# Patient Record
Sex: Female | Born: 1935 | Race: White | Hispanic: No | Marital: Married | Smoking: Former smoker
Health system: Southern US, Community
[De-identification: ages and names within clinical notes are randomized; demographics above are authoritative.]

## PROBLEM LIST (undated history)

## (undated) DIAGNOSIS — G4700 Insomnia, unspecified: Secondary | ICD-10-CM

## (undated) DIAGNOSIS — I493 Ventricular premature depolarization: Secondary | ICD-10-CM

## (undated) DIAGNOSIS — H269 Unspecified cataract: Secondary | ICD-10-CM

## (undated) DIAGNOSIS — J45909 Unspecified asthma, uncomplicated: Secondary | ICD-10-CM

## (undated) DIAGNOSIS — E039 Hypothyroidism, unspecified: Secondary | ICD-10-CM

## (undated) DIAGNOSIS — Z9889 Other specified postprocedural states: Secondary | ICD-10-CM

## (undated) DIAGNOSIS — C569 Malignant neoplasm of unspecified ovary: Secondary | ICD-10-CM

## (undated) HISTORY — PX: THYROID SURGERY: SHX805

## (undated) HISTORY — PX: EYE SURGERY: SHX253

## (undated) HISTORY — PX: HERNIA REPAIR: SHX51

## (undated) HISTORY — PX: TOTAL VAGINAL HYSTERECTOMY: SHX2548

## (undated) HISTORY — DX: Other specified postprocedural states: Z98.890

## (undated) HISTORY — DX: Ventricular premature depolarization: I49.3

## (undated) HISTORY — PX: BREAST SURGERY: SHX581

## (undated) HISTORY — PX: TOE SURGERY: SHX1073

## (undated) HISTORY — DX: Insomnia, unspecified: G47.00

## (undated) HISTORY — DX: Hypothyroidism, unspecified: E03.9

## (undated) HISTORY — DX: Malignant neoplasm of unspecified ovary: C56.9

## (undated) HISTORY — DX: Unspecified cataract: H26.9

## (undated) HISTORY — DX: Unspecified asthma, uncomplicated: J45.909

---

## 1997-03-16 DIAGNOSIS — C569 Malignant neoplasm of unspecified ovary: Secondary | ICD-10-CM

## 1997-03-16 HISTORY — DX: Malignant neoplasm of unspecified ovary: C56.9

## 2000-08-06 ENCOUNTER — Other Ambulatory Visit: Admission: RE | Admit: 2000-08-06 | Discharge: 2000-08-06 | Payer: Self-pay | Admitting: Urology

## 2000-09-09 ENCOUNTER — Inpatient Hospital Stay (HOSPITAL_COMMUNITY): Admission: RE | Admit: 2000-09-09 | Discharge: 2000-09-10 | Payer: Self-pay | Admitting: General Surgery

## 2000-09-17 ENCOUNTER — Encounter: Payer: Self-pay | Admitting: Emergency Medicine

## 2000-09-17 ENCOUNTER — Emergency Department (HOSPITAL_COMMUNITY): Admission: EM | Admit: 2000-09-17 | Discharge: 2000-09-17 | Payer: Self-pay | Admitting: Emergency Medicine

## 2001-05-19 ENCOUNTER — Encounter: Payer: Self-pay | Admitting: Urology

## 2001-05-19 ENCOUNTER — Ambulatory Visit (HOSPITAL_COMMUNITY): Admission: RE | Admit: 2001-05-19 | Discharge: 2001-05-19 | Payer: Self-pay | Admitting: Urology

## 2001-06-16 ENCOUNTER — Ambulatory Visit (HOSPITAL_COMMUNITY): Admission: RE | Admit: 2001-06-16 | Discharge: 2001-06-16 | Payer: Self-pay | Admitting: General Surgery

## 2001-06-16 ENCOUNTER — Encounter: Payer: Self-pay | Admitting: General Surgery

## 2002-03-16 DIAGNOSIS — Z9889 Other specified postprocedural states: Secondary | ICD-10-CM

## 2002-03-16 HISTORY — DX: Other specified postprocedural states: Z98.890

## 2002-06-20 ENCOUNTER — Encounter: Payer: Self-pay | Admitting: Obstetrics and Gynecology

## 2002-06-20 ENCOUNTER — Ambulatory Visit (HOSPITAL_COMMUNITY): Admission: RE | Admit: 2002-06-20 | Discharge: 2002-06-20 | Payer: Self-pay | Admitting: Obstetrics and Gynecology

## 2002-06-27 ENCOUNTER — Ambulatory Visit (HOSPITAL_COMMUNITY): Admission: RE | Admit: 2002-06-27 | Discharge: 2002-06-27 | Payer: Self-pay | Admitting: Family Medicine

## 2002-06-27 ENCOUNTER — Encounter: Payer: Self-pay | Admitting: Family Medicine

## 2003-06-26 ENCOUNTER — Ambulatory Visit (HOSPITAL_COMMUNITY): Admission: RE | Admit: 2003-06-26 | Discharge: 2003-06-26 | Payer: Self-pay | Admitting: Obstetrics and Gynecology

## 2003-12-17 ENCOUNTER — Ambulatory Visit (HOSPITAL_COMMUNITY): Admission: RE | Admit: 2003-12-17 | Discharge: 2003-12-17 | Payer: Self-pay | Admitting: Podiatry

## 2004-03-26 ENCOUNTER — Ambulatory Visit (HOSPITAL_COMMUNITY): Admission: RE | Admit: 2004-03-26 | Discharge: 2004-03-27 | Payer: Self-pay | Admitting: Family Medicine

## 2004-03-28 ENCOUNTER — Ambulatory Visit: Payer: Self-pay | Admitting: *Deleted

## 2004-07-09 ENCOUNTER — Ambulatory Visit (HOSPITAL_COMMUNITY): Admission: RE | Admit: 2004-07-09 | Discharge: 2004-07-09 | Payer: Self-pay | Admitting: Obstetrics and Gynecology

## 2005-07-15 ENCOUNTER — Ambulatory Visit (HOSPITAL_COMMUNITY): Admission: RE | Admit: 2005-07-15 | Discharge: 2005-07-15 | Payer: Self-pay | Admitting: Obstetrics and Gynecology

## 2006-08-05 ENCOUNTER — Ambulatory Visit (HOSPITAL_COMMUNITY): Admission: RE | Admit: 2006-08-05 | Discharge: 2006-08-05 | Payer: Self-pay | Admitting: Obstetrics and Gynecology

## 2007-01-31 ENCOUNTER — Other Ambulatory Visit: Admission: RE | Admit: 2007-01-31 | Discharge: 2007-01-31 | Payer: Self-pay | Admitting: Obstetrics and Gynecology

## 2007-08-10 ENCOUNTER — Ambulatory Visit (HOSPITAL_COMMUNITY): Admission: RE | Admit: 2007-08-10 | Discharge: 2007-08-10 | Payer: Self-pay | Admitting: Obstetrics and Gynecology

## 2008-02-08 ENCOUNTER — Other Ambulatory Visit: Admission: RE | Admit: 2008-02-08 | Discharge: 2008-02-08 | Payer: Self-pay | Admitting: Obstetrics and Gynecology

## 2008-08-21 ENCOUNTER — Ambulatory Visit (HOSPITAL_COMMUNITY): Admission: RE | Admit: 2008-08-21 | Discharge: 2008-08-21 | Payer: Self-pay | Admitting: Obstetrics and Gynecology

## 2009-07-24 ENCOUNTER — Other Ambulatory Visit: Admission: RE | Admit: 2009-07-24 | Discharge: 2009-07-24 | Payer: Self-pay | Admitting: Obstetrics and Gynecology

## 2009-08-23 ENCOUNTER — Ambulatory Visit: Payer: Self-pay | Admitting: Internal Medicine

## 2009-08-23 DIAGNOSIS — Z860100 Personal history of colon polyps, unspecified: Secondary | ICD-10-CM | POA: Insufficient documentation

## 2009-08-23 DIAGNOSIS — K625 Hemorrhage of anus and rectum: Secondary | ICD-10-CM | POA: Insufficient documentation

## 2009-09-05 ENCOUNTER — Ambulatory Visit (HOSPITAL_COMMUNITY): Admission: RE | Admit: 2009-09-05 | Discharge: 2009-09-05 | Payer: Self-pay | Admitting: Obstetrics and Gynecology

## 2009-09-12 ENCOUNTER — Encounter: Payer: Self-pay | Admitting: Internal Medicine

## 2009-09-18 ENCOUNTER — Ambulatory Visit (HOSPITAL_COMMUNITY): Admission: RE | Admit: 2009-09-18 | Discharge: 2009-09-18 | Payer: Self-pay | Admitting: Obstetrics and Gynecology

## 2010-04-15 NOTE — Assessment & Plan Note (Signed)
Summary: change in bowel habits- due for TCS- cdg   Primary Care Provider:  Simone Curia  Chief Complaint:  change in bowel habits/.  History of Present Illness: 77 year old lady comes to see me for further evaluation of rectal bleeding and a  distant history of colonic polyps. This nice lady tells me that she had a history of colonic polyps removed by Dr. Karilyn Cota many years ago. Last colonoscopy was done by Dr. Viviann Spare [facility] 2004. She had diverticulosis and hemorrhoids. She has a tendency towards chronic constipation, intermittently passing small amount of blood per rectum.  She has a fiber supplement each day at consisting of a fiber cereal and added fiber supplement and takes a little olive  -  bowel movement  once daily. Has not had any melena; no upper GI tract symptoms such as  reflux, nausea, vomiting  or dysphagia. Family history significant for breast cancer but no history of colon polyps or colon cancer. She was told to return 2009 for colonoscopy but did not do so.    Current Medications (verified): 1)  Levothroid 100 Mcg Tabs (Levothyroxine Sodium) .... Take 1 Tablet By Mouth Once A Day 2)  Calcium Citrate Plus D .... Two Tablets Daily 3)  Daily Multi  Tabs (Multiple Vitamins-Minerals) .... Take 1 Tablet By Mouth Once A Day 4)  Aspirin 81 Mg Tbec (Aspirin) .... Take 1 Tablet By Mouth Once A Day 5)  Xalatan 0.005 % Soln (Latanoprost) .... As Directed 6)  Azopt 1 % Susp (Brinzolamide) .... As Directed 7)  Stool Softners .... Take 3 At Bedtime 8)  Fiber Therapy Capsules .... 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  Past Medical History: Hypothyroidsm Ovarian Cancer Surgical Hernia from Hysterectomy Glaucoma in both eyes Cataract in left eye  Past Surgical History: Hysterectomy  Hernia Repair x 2 (hernia from hysterctomy) Thyroid surgery 1966 Left foot (two toes) Right foot (two toes) Left foot  (two toes again) Eye surgery   Family History: Father: Deceased age 81   Lung cancer Mother:Deceased age 80  Emphysema   and heart trouble  Siblings: 4 sisters  One sister had breast cancer and one deceased with  cirrhosis  Social History: Marital Status: Married Children: 2 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 Conjunctiva are pink Abdomen:  flat positive bowel sounds entirely soft and nontender without appreciable mass or organomegaly Rectal:  deferred until time of colonoscopy  Impression & Recommendations: Impression: A 77 year old lady with a distant history of colonic polyps now with intermittent bright red blood per rectum somewhat overdue for followup colonoscopy. Some tendency towards constipation but managed very well with substantial daily fiber supplementation.  Recommendations: Diagnostic colonoscopy in the near future. Risks, benefits, limitations, imponderables and alternatives were reviewed with Ms. Tammy Mosley. Her questions have been answered. She is agreeable. Further recommendations to follow.  Appended Document: change in bowel  habits- due for TCS- cdg

## 2010-04-15 NOTE — Letter (Signed)
Summary: Internal Other  Internal Other   Imported By: Peggyann Shoals 09/12/2009 13:38:44  _____________________________________________________________________  External Attachment:    Type:   Image     Comment:   External Document

## 2010-04-15 NOTE — Letter (Signed)
Summary: TCS ORDER  TCS ORDER   Imported By: Ave Filter 08/23/2009 10:54:21  _____________________________________________________________________  External Attachment:    Type:   Image     Comment:   External Document

## 2010-06-19 ENCOUNTER — Encounter: Payer: Self-pay | Admitting: Gastroenterology

## 2010-06-19 ENCOUNTER — Ambulatory Visit (INDEPENDENT_AMBULATORY_CARE_PROVIDER_SITE_OTHER): Payer: Medicare Other | Admitting: Gastroenterology

## 2010-06-19 DIAGNOSIS — K5900 Constipation, unspecified: Secondary | ICD-10-CM

## 2010-06-19 DIAGNOSIS — K921 Melena: Secondary | ICD-10-CM

## 2010-06-19 DIAGNOSIS — K625 Hemorrhage of anus and rectum: Secondary | ICD-10-CM

## 2010-06-19 MED ORDER — PEG 3350-KCL-NA BICARB-NACL 420 G PO SOLR: ORAL | Status: AC

## 2010-06-19 NOTE — Progress Notes (Signed)
Referring Provider: No ref. provider found Primary Care Physician:  Harlow Asa, MD  Chief Complaint  Patient presents with  . Pre-op Exam    colonoscopy    HPI:   Ms. Tammy Mosley is a pleasant 77 year old Caucasian female who presents today in consult for follow-up colonoscopy. Last seen by [place] in June 2011 secondary to rectal bleeding. Has hx of chronic constipation and hemorrhoids. Notices small to moderate amount of brbpr after being constipated for a few days. Attempts to eat bulky foods, fruits. Generally has a BM every day. Occasionally experiences LLQ mild discomfort where hernia repair was performed with mesh. Denies N/V. No lack of appetite. Had to cancel appt last year for colonoscopy secondary to husband falling and requiring 2 hip surgeries. Has distant hx of colonic polyps.  She is doing quite well and really has no complaints today.  Past Medical History  Diagnosis Date  . Ovarian cancer 1999  . Constipation   . Hypothyroidism   . Glaucoma   . Cataracts, bilateral     Past Surgical History  Procedure Date  . Thyroid surgery   . Total vaginal hysterectomy     due to ovarian cancer  . Hernia repair     X 2  . Breast surgery     calcification removal, L breast  . Toe surgery     toes on both feet straightened  . Eye surgery     Current Outpatient Prescriptions  Medication Sig Dispense Refill  . Bimatoprost (LUMIGAN) 0.01 % SOLN Apply 1 drop to eye daily.        . dorzolamide (TRUSOPT) 2 % ophthalmic solution Place 1 drop into both eyes 2 (two) times daily.        Marland Kitchen levothyroxine (SYNTHROID, LEVOTHROID) 112 MCG tablet Take 112 mcg by mouth daily.          Allergies as of 06/19/2010  . (No Known Allergies)    Family History  Problem Relation Age of Onset  . Colon cancer Neg Hx   . Lung cancer Father 51    deceased  . Emphysema Mother 40    deceased  . Breast cancer Sister     living     History   Social History  . Marital Status: Married   Social  History Main Topics  . Smoking status: Never Smoker   . Smokeless tobacco: Never Used  . Alcohol Use: No  . Drug Use: No   Review of Systems: Gen: Denies fever, chills, anorexia. Denies fatigue, weakness, weight loss.  CV: Denies chest pain, palpitations, syncope, peripheral edema, and claudication. Resp: Denies dyspnea at rest, cough, wheezing, coughing up blood, and pleurisy. GI: See HPI Derm: Denies rash, itching, dry skin Psych: Denies depression, anxiety, memory loss, confusion. No homicidal or suicidal ideation.  Heme: Denies bruising, bleeding, and enlarged lymph nodes.  Physical Exam: BP 160/85  Pulse 77  Temp 98.2 F (36.8 C)  Ht 5\' 7"  (1.702 m)  Wt 171 lb (77.565 kg)  BMI 26.78 kg/m2  SpO2 96% General:   Alert and oriented. No distress noted. Pleasant and cooperative.  Head:  Normocephalic and atraumatic. Eyes:  Conjuctiva clear without scleral icterus. Mouth:  Oral mucosa pink and moist. Good dentition. No lesions. Heart:  S1, S2 present without murmurs, rubs, or gallops. Regular rate and rhythm. Abdomen:  +BS, soft, non-tender and non-distended. No rebound or guarding. No HSM or masses noted. Msk:  Symmetrical without gross deformities. Normal posture. Extremities:  Without edema. Neurologic:  Alert and  oriented x4;  grossly normal neurologically. Skin:  Intact without significant lesions or rashes. Psych:  Alert and cooperative. Normal mood and affect.

## 2010-06-19 NOTE — Patient Instructions (Signed)
We have set you up for a colonoscopy.  Please continue with your high fiber diet We will make further recommendations after the procedure No changes to medication currently.

## 2010-06-22 ENCOUNTER — Encounter: Payer: Self-pay | Admitting: Gastroenterology

## 2010-06-22 DIAGNOSIS — K5900 Constipation, unspecified: Secondary | ICD-10-CM | POA: Insufficient documentation

## 2010-06-22 NOTE — Assessment & Plan Note (Signed)
Likely secondary to known hx of hemorrhoids. Small to mod amount after bouts of constipation. Due for colonoscopy. Will undergo colonoscopy in near future with further recommendations to follow.

## 2010-06-22 NOTE — Assessment & Plan Note (Signed)
77 year old Caucasian female with hx of chronic constipation; managing well on own with high fiber diet, averaging a BM daily. Does note small to moderate amt of brbpr after bouts of constipation. Has hx of hemorrhoids; likely low-volume hematochezia secondary to hemorrhoids. Somewhat overdue for colonoscopy; last performed in 2004 by Dr. Michaelyn Barter.    Continue high fiber diet Proceed with TCS with Dr. Jena Gauss in near future: the risks, benefits, and alternatives have been discussed with the patient in detail. The patient states understanding and desires to proceed.

## 2010-07-01 ENCOUNTER — Encounter: Payer: No Typology Code available for payment source | Admitting: Internal Medicine

## 2010-07-01 ENCOUNTER — Encounter: Payer: Medicare Other | Admitting: Internal Medicine

## 2010-07-01 ENCOUNTER — Ambulatory Visit (HOSPITAL_COMMUNITY)
Admission: RE | Admit: 2010-07-01 | Discharge: 2010-07-01 | Disposition: A | Discharge status: Home / Self Care | Payer: Medicare Other | Source: Ambulatory Visit | Attending: Internal Medicine | Admitting: Internal Medicine

## 2010-07-01 DIAGNOSIS — K921 Melena: Secondary | ICD-10-CM

## 2010-07-01 DIAGNOSIS — K648 Other hemorrhoids: Secondary | ICD-10-CM | POA: Insufficient documentation

## 2010-07-01 DIAGNOSIS — K5730 Diverticulosis of large intestine without perforation or abscess without bleeding: Secondary | ICD-10-CM | POA: Insufficient documentation

## 2010-07-06 NOTE — Op Note (Signed)
  NAME:  Tammy Mosley, Tammy Mosley                  ACCOUNT NO.:  [id number]  MEDICAL RECORD NO.:  [id number]           PATIENT TYPE:  O  LOCATION:  DAYP                          FACILITY:  [facility]  PHYSICIAN:  R. Roetta Sessions, M.D. DATE OF BIRTH:  [birth year]  DATE OF PROCEDURE:  07/01/2010 DATE OF DISCHARGE:                              OPERATIVE REPORT   INDICATIONS FOR PROCEDURE:  A 77 year old lady with chronic constipation now with intermittent hematochezia.  Last colonoscopy was nearly 10 years ago without significant findings reportedly (elsewhere). Colonoscopy is now being done.  Risks, benefits, limitations, alternatives and imponderables have been reviewed, questions answered. Please see the documentation in the medical record.  PROCEDURE NOTE:  O2 saturation, blood pressure, pulse and respirations were monitored throughout the entire procedure.  CONSCIOUS SEDATION: 1. Versed 2 mg IV. 2. Demerol 50 mg IV in divided doses.  INSTRUMENT:  Pentax video chip system.  FINDINGS:  Digital rectal exam revealed no abnormalities.  Endoscopic findings:  Prep was adequate.  Colon:  Colonic mucosa was surveyed from the rectosigmoid junction through the left transverse right colon through the appendiceal orifice, ileocecal valve/cecum.  These structures were well seen and photographed for the record.  From this level, the scope was slowly and cautiously withdrawn.  All previously mentioned mucosal surfaces were again seen.  The patient had scattered left-sided diverticula.  The remainder of the colonic mucosa appeared normal.  Scope was pulled down into the rectum, where a thorough examination of the rectal mucosa including retroflexed view of the anal verge en face view of the anal canal demonstrated minimal friable anal canal hemorrhoids only.  The patient tolerated the procedure well. Cecal withdrawal time 7 minutes.  IMPRESSION: 1. Minimally friable anal canal hemorrhoids, otherwise normal  rectum. 2. Left-sided diverticula.  The remainder of the colonic mucosa     appeared normal.  RECOMMENDATIONS: 1. Hemorrhoid and diverticulosis literature provided to Ms. Garate with a     10-day course of Anusol-HC suppositories one per rectum at bedtime. 2. Daily fiber 1 tablespoon. 3. MiraLax 17 g orally at bedtime nightly any given day with no good     bowel movement. 4. Ms. Treaster is to let me know if this regimen does not facilitate her     bowel regimen.  She is also to let me know if she has any further     rectal bleeding.     Jonathon Bellows, M.D.     RMR/MEDQ  D:  07/01/2010  T:  07/01/2010  Job:  [id number]  cc:   Donna Bernard, M.D. Fax: [phone]  Electronically Signed by Lorrin Goodell M.D. on 07/06/2010 02:08:10 PM

## 2010-08-01 NOTE — Op Note (Signed)
NAME:  Tammy Mosley, Tammy Mosley                  ACCOUNT NO.:  [id number]   MEDICAL RECORD NO.:  [id number]          PATIENT TYPE:  AMB   LOCATION:  DAY                           FACILITY:  [facility]   PHYSICIAN:  Oley Balm. Pricilla Holm, D.P.M.DATE OF BIRTH:  [birth year]   DATE OF PROCEDURE:  12/17/2003  DATE OF DISCHARGE:                                 OPERATIVE REPORT   PREOPERATIVE DIAGNOSIS:  Hammer toe deformity with abductovarus rotation,  third and fourth toes, left foot.   POSTOPERATIVE DIAGNOSIS:  Hammer toe deformity with abductovarus rotation,  third and fourth toes, left foot.   PROCEDURES:  1.  Interphalangeal fusion with arthroplasty, third toe, left foot.  2.  Distal interphalangeal joint arthroplasty, fourth toe, left foot.   SURGEON OF RECORD:  Oley Balm. Pricilla Holm, D.P.M.   ANESTHESIA:  Monitored anesthesia care.   INDICATIONS FOR SURGERY:  A longstanding history of pain and underlapping  third and fourth toes.   The patient brought in the operating room and placed on the operating table  in the supine position.  The patient's left foot and leg were then prepped  and draped in the usual aseptic manner.  Then with an ankle tourniquet in  place and well-padded to prevent contusion, elevated to 250 mmHg,  exsanguination of the left foot.  The following surgical procedures were  then performed under monitored anesthesia care and locally-infiltrated 2%  Xylocaine and 0.05% Marcaine.   ARTHROPLASTY WITH INTERPHALANGEAL FUSION, THIRD TOE, LEFT FOOT:  Attention  directed to the dorsal lateral aspect of the third toe, left foot, where a  teardrop incision was made in a transverse manner.  The intervening flap of  skin was removed.  The incision was made into the interphalangeal joint and  the head of the phalanx freed from its soft tissue attachments dorsally,  medially, laterally, and plantarly.  Utilizing a Zimmer oscillating saw, the  capital fragment of the head of the phalanx was then  resected, the cartilage  from the base denuded, the wound lavaged with a copious amount of sterile  saline, and a pin was retrograded from the distal end of the toe to the  proximal phalanx, stabilizing the toe.  The wound lavaged with a copious  amount of saline.  It as noted that the toe was in a more correct anatomical  as well as functional position.  The wound lavaged with a copious amount of  sterile saline and capsule, subcutaneous tissues __________ sutured with 4-0  Dexon.  The skin was approximated utilizing a horizontal mattress suture of  4-0 Prolene.  Due to the contracture of the flexor tendon, a stab tenotomy  was performed plantarly.  This was accomplished via stab incision and then a  percutaneous release of the tendon.  This was closed with a horizontal  mattress suture of 4-0 Prolene.   DEROTATIONAL ARTHROPLASTY, DISTAL INTERPHALANGEAL JOINT:  Attention was  directed to the distal aspect of the third toe, where a teardrop incision  was made transversely.  This had the effect of abducting the toe.  The  incision was widened  and deepened and the intervening flap of skin was  removed.  The incision was widened and deepened via sharp and blunt  dissection, being sure to identify and retract all vital structures.  A  capsular incision was then made and the phalanx was then revealed and  resected.  The head of the phalanx was then resected utilizing a Zimmer  oscillating saw.  The wound lavaged with a copious amount of sterile saline  and the tendon was reapproximated utilizing one horizontal mattress of 4-0  Dexon.  This had the effect of derotating the distal end of the toe and then  the incision was closed utilizing 4-0 Prolene.  The wound lavaged with a  copious amount of sterile saline before closure.   All surgical sites were then infiltrated with approximately 1 mL each of  dexamethasone phosphate, a mild compressive bandage consisting of Betadine-  soaked Adaptics, 4  x 4's, sterile Kling then applied.  The patient tolerated  the procedure well and left the operating room in apparent good condition  with vital signs stable to the recovery room.      DBT/MEDQ  D:  12/17/2003  T:  12/17/2003  Job:  [id number]

## 2010-08-01 NOTE — H&P (Signed)
NAME:  Tammy Mosley, Tammy Mosley                  ACCOUNT NO.:  [id number]   MEDICAL RECORD NO.:  [id number]          PATIENT TYPE:  AMB   LOCATION:  DAY                           FACILITY:  [facility]   PHYSICIAN:  Oley Balm. Pricilla Holm, D.P.M.DATE OF BIRTH:  Nov 06, 1928   DATE OF ADMISSION:  12/17/2003  DATE OF DISCHARGE:  LH                                HISTORY & PHYSICAL   HISTORY OF PRESENT ILLNESS:  Tammy Mosley is a 77 year old white female who  presented to the office with a chief complaint of painful hammertoe  deformity of the 3rd toe of her left foot.  The patient relates that she had  previous surgery by Dr. Alona Bene of the 2nd toe, but now the 3rd toe has  developed pain.  The patient relates the inability to wear enclosed shoes  for same.   PREVIOUS HOSPITALIZATIONS AND SURGERY:  1.  Previous foot surgery.  2.  Ovarian cancer.  3.  Thyroid biopsy x2.   MEDICATIONS:  Synthroid, calcium, stool softener, over-the-counter sinus  medication, ibuprofen p.r.n.   ALLERGIES:  TAPE.   FAMILY HISTORY:  Family history of arthritis.   SOCIAL HISTORY:  No drinking, no smoking, no transfusion, no hepatitis.   REVIEW OF SYSTEMS:  Review of systems reveals a history of asthma, cancer,  glaucoma, arthritis.  She has an irregular heart beat.   PHYSICAL EXAMINATION:  EXTREMITIES:  Lower extremity exam revealed palpable  pedal pulses, both DP and PT with spontaneous capillary fill time.  NEUROLOGIC:  Exam essentially within normal limits.  MUSCULOSKELETAL:  Exam reveals dorsally contracted 3rd toe of the left foot  and 4th toe of the left foot.   ASSESSMENT AND PLAN:  I have reviewed with her both conservative and  surgical management; the patient requests surgical correction.  I have  reviewed with her interphalangeal fusion with arthroplasty and reviewed with  her the procedures and complications to procedure such as infection, bone  infection, postoperative pain and swelling, etc.  The patient understands  the same and surgery has been scheduled for December 17, 2003.      DBT/MEDQ  D:  12/16/2003  T:  12/17/2003  Job:  [id number]

## 2010-09-29 ENCOUNTER — Other Ambulatory Visit: Payer: Self-pay | Admitting: Obstetrics and Gynecology

## 2010-09-29 DIAGNOSIS — Z139 Encounter for screening, unspecified: Secondary | ICD-10-CM

## 2010-10-02 ENCOUNTER — Ambulatory Visit (HOSPITAL_COMMUNITY)
Admission: RE | Admit: 2010-10-02 | Discharge: 2010-10-02 | Disposition: A | Discharge status: Home / Self Care | Payer: Medicare Other | Source: Ambulatory Visit | Attending: Obstetrics and Gynecology | Admitting: Obstetrics and Gynecology

## 2010-10-02 DIAGNOSIS — Z139 Encounter for screening, unspecified: Secondary | ICD-10-CM

## 2010-10-02 DIAGNOSIS — Z1231 Encounter for screening mammogram for malignant neoplasm of breast: Secondary | ICD-10-CM | POA: Insufficient documentation

## 2011-03-24 DIAGNOSIS — H4010X0 Unspecified open-angle glaucoma, stage unspecified: Secondary | ICD-10-CM | POA: Diagnosis not present

## 2011-03-24 DIAGNOSIS — H2510 Age-related nuclear cataract, unspecified eye: Secondary | ICD-10-CM | POA: Diagnosis not present

## 2011-03-24 DIAGNOSIS — H409 Unspecified glaucoma: Secondary | ICD-10-CM | POA: Diagnosis not present

## 2011-03-24 DIAGNOSIS — Z961 Presence of intraocular lens: Secondary | ICD-10-CM | POA: Diagnosis not present

## 2011-05-20 DIAGNOSIS — H4011X0 Primary open-angle glaucoma, stage unspecified: Secondary | ICD-10-CM | POA: Diagnosis not present

## 2011-05-20 DIAGNOSIS — H409 Unspecified glaucoma: Secondary | ICD-10-CM | POA: Diagnosis not present

## 2011-06-03 DIAGNOSIS — Z83511 Family history of glaucoma: Secondary | ICD-10-CM | POA: Diagnosis not present

## 2011-06-03 DIAGNOSIS — H409 Unspecified glaucoma: Secondary | ICD-10-CM | POA: Diagnosis not present

## 2011-06-03 DIAGNOSIS — H4011X0 Primary open-angle glaucoma, stage unspecified: Secondary | ICD-10-CM | POA: Diagnosis not present

## 2011-07-09 DIAGNOSIS — R5383 Other fatigue: Secondary | ICD-10-CM | POA: Diagnosis not present

## 2011-07-09 DIAGNOSIS — R5381 Other malaise: Secondary | ICD-10-CM | POA: Diagnosis not present

## 2011-07-09 DIAGNOSIS — G4700 Insomnia, unspecified: Secondary | ICD-10-CM | POA: Diagnosis not present

## 2011-07-09 DIAGNOSIS — I959 Hypotension, unspecified: Secondary | ICD-10-CM | POA: Diagnosis not present

## 2011-07-10 DIAGNOSIS — R5381 Other malaise: Secondary | ICD-10-CM | POA: Diagnosis not present

## 2011-07-10 DIAGNOSIS — E039 Hypothyroidism, unspecified: Secondary | ICD-10-CM | POA: Diagnosis not present

## 2011-07-10 DIAGNOSIS — Z79899 Other long term (current) drug therapy: Secondary | ICD-10-CM | POA: Diagnosis not present

## 2011-07-10 DIAGNOSIS — D649 Anemia, unspecified: Secondary | ICD-10-CM | POA: Diagnosis not present

## 2011-07-20 DIAGNOSIS — Z83511 Family history of glaucoma: Secondary | ICD-10-CM | POA: Diagnosis not present

## 2011-07-20 DIAGNOSIS — H409 Unspecified glaucoma: Secondary | ICD-10-CM | POA: Diagnosis not present

## 2011-07-20 DIAGNOSIS — H4011X0 Primary open-angle glaucoma, stage unspecified: Secondary | ICD-10-CM | POA: Diagnosis not present

## 2011-09-15 DIAGNOSIS — H209 Unspecified iridocyclitis: Secondary | ICD-10-CM | POA: Diagnosis not present

## 2011-09-15 DIAGNOSIS — H4011X0 Primary open-angle glaucoma, stage unspecified: Secondary | ICD-10-CM | POA: Diagnosis not present

## 2011-09-15 DIAGNOSIS — H409 Unspecified glaucoma: Secondary | ICD-10-CM | POA: Diagnosis not present

## 2011-10-13 DIAGNOSIS — H4011X0 Primary open-angle glaucoma, stage unspecified: Secondary | ICD-10-CM | POA: Diagnosis not present

## 2011-10-13 DIAGNOSIS — H4010X0 Unspecified open-angle glaucoma, stage unspecified: Secondary | ICD-10-CM | POA: Diagnosis not present

## 2011-10-13 DIAGNOSIS — H409 Unspecified glaucoma: Secondary | ICD-10-CM | POA: Diagnosis not present

## 2011-10-16 ENCOUNTER — Other Ambulatory Visit: Payer: Self-pay | Admitting: Family Medicine

## 2011-10-16 DIAGNOSIS — IMO0001 Reserved for inherently not codable concepts without codable children: Secondary | ICD-10-CM

## 2011-11-12 ENCOUNTER — Ambulatory Visit (HOSPITAL_COMMUNITY): Admission: RE | Admit: 2011-11-12 | Payer: Medicare Other | Source: Ambulatory Visit

## 2011-12-02 DIAGNOSIS — H4011X0 Primary open-angle glaucoma, stage unspecified: Secondary | ICD-10-CM | POA: Diagnosis not present

## 2011-12-02 DIAGNOSIS — H409 Unspecified glaucoma: Secondary | ICD-10-CM | POA: Diagnosis not present

## 2012-01-27 DIAGNOSIS — H409 Unspecified glaucoma: Secondary | ICD-10-CM | POA: Diagnosis not present

## 2012-01-27 DIAGNOSIS — H4010X0 Unspecified open-angle glaucoma, stage unspecified: Secondary | ICD-10-CM | POA: Diagnosis not present

## 2012-03-24 DIAGNOSIS — H409 Unspecified glaucoma: Secondary | ICD-10-CM | POA: Diagnosis not present

## 2012-03-24 DIAGNOSIS — H4011X0 Primary open-angle glaucoma, stage unspecified: Secondary | ICD-10-CM | POA: Diagnosis not present

## 2012-05-25 DIAGNOSIS — H409 Unspecified glaucoma: Secondary | ICD-10-CM | POA: Diagnosis not present

## 2012-05-25 DIAGNOSIS — H4011X0 Primary open-angle glaucoma, stage unspecified: Secondary | ICD-10-CM | POA: Diagnosis not present

## 2012-06-20 ENCOUNTER — Encounter: Payer: Self-pay | Admitting: Family Medicine

## 2012-06-20 ENCOUNTER — Ambulatory Visit (INDEPENDENT_AMBULATORY_CARE_PROVIDER_SITE_OTHER): Payer: Medicare Other | Admitting: Family Medicine

## 2012-06-20 VITALS — BP 130/76 | Temp 98.8°F | Wt 158.0 lb

## 2012-06-20 DIAGNOSIS — E785 Hyperlipidemia, unspecified: Secondary | ICD-10-CM

## 2012-06-20 DIAGNOSIS — E039 Hypothyroidism, unspecified: Secondary | ICD-10-CM | POA: Insufficient documentation

## 2012-06-20 MED ORDER — AMOXICILLIN 500 MG PO TABS: ORAL_TABLET | ORAL | Status: DC

## 2012-06-20 NOTE — Progress Notes (Signed)
  Subjective:    Patient ID: Tammy Mosley, female    DOB: 1928-05-11, 79 y.o.   MRN: [id number]  Cough This is a new problem. The current episode started in the past 7 days. The problem has been gradually worsening. The problem occurs every few minutes. The cough is productive of sputum. Associated symptoms include a rash (few days) and rhinorrhea. Pertinent negatives include no fever.  Rash Associated symptoms include coughing and rhinorrhea. Pertinent negatives include no fever.      Review of Systems  Constitutional: Negative for fever.  HENT: Positive for rhinorrhea.   Respiratory: Positive for cough.   Skin: Positive for rash (few days).       Objective:   Physical Exam   Alert no acute distress. HEENT mild nasal congestion. Right cheek tenderness. Pharynx normal. Lungs clear. Heart regular in rhythm. Very slight patch of eczema and noted right anterior chest. Chest wall borderline tender right upper chest wall anterior    Assessment & Plan:  Impression sinusitis bronchitis. Patient notes also some right-sided chest pain. Sharp in nature. Worse with a cough. Likely chest wall from coughing. Plan symptomatic care discussed. Amoxicillin 500 3 times a day 10 days. Warning signs discussed. WSL

## 2012-06-22 DIAGNOSIS — E039 Hypothyroidism, unspecified: Secondary | ICD-10-CM | POA: Diagnosis not present

## 2012-06-22 DIAGNOSIS — E785 Hyperlipidemia, unspecified: Secondary | ICD-10-CM | POA: Diagnosis not present

## 2012-06-22 LAB — CBC WITH DIFFERENTIAL/PLATELET
Basophils Relative: 0 % (ref 0–1)
HCT: 42.9 % (ref 36.0–46.0)
Hemoglobin: 14.2 g/dL (ref 12.0–15.0)
Lymphocytes Relative: 16 % (ref 12–46)
Lymphs Abs: 1.1 10*3/uL (ref 0.7–4.0)
MCH: 27.7 pg (ref 26.0–34.0)
MCHC: 33.1 g/dL (ref 30.0–36.0)
Neutro Abs: 5.1 10*3/uL (ref 1.7–7.7)
Neutrophils Relative %: 71 % (ref 43–77)
RBC: 5.13 MIL/uL — ABNORMAL HIGH (ref 3.87–5.11)
WBC: 7.1 10*3/uL (ref 4.0–10.5)

## 2012-06-22 LAB — LIPID PANEL
HDL: 49 mg/dL (ref >39)
Total CHOL/HDL Ratio: 5.2 Ratio
Triglycerides: 78 mg/dL (ref <150)

## 2012-07-11 DIAGNOSIS — H409 Unspecified glaucoma: Secondary | ICD-10-CM | POA: Diagnosis not present

## 2012-07-11 DIAGNOSIS — H4011X0 Primary open-angle glaucoma, stage unspecified: Secondary | ICD-10-CM | POA: Diagnosis not present

## 2012-07-11 DIAGNOSIS — H2510 Age-related nuclear cataract, unspecified eye: Secondary | ICD-10-CM | POA: Diagnosis not present

## 2012-07-14 ENCOUNTER — Ambulatory Visit (INDEPENDENT_AMBULATORY_CARE_PROVIDER_SITE_OTHER): Payer: Medicare Other | Admitting: Nurse Practitioner

## 2012-07-14 ENCOUNTER — Encounter: Payer: Self-pay | Admitting: Nurse Practitioner

## 2012-07-14 ENCOUNTER — Ambulatory Visit (HOSPITAL_COMMUNITY)
Admission: RE | Admit: 2012-07-14 | Discharge: 2012-07-14 | Disposition: A | Discharge status: Home / Self Care | Payer: Medicare Other | Source: Ambulatory Visit | Attending: Nurse Practitioner | Admitting: Nurse Practitioner

## 2012-07-14 VITALS — BP 130/70 | Temp 98.1°F | Wt 156.0 lb

## 2012-07-14 DIAGNOSIS — J45901 Unspecified asthma with (acute) exacerbation: Secondary | ICD-10-CM | POA: Diagnosis not present

## 2012-07-14 DIAGNOSIS — R05 Cough: Secondary | ICD-10-CM | POA: Diagnosis not present

## 2012-07-14 DIAGNOSIS — J069 Acute upper respiratory infection, unspecified: Secondary | ICD-10-CM | POA: Diagnosis not present

## 2012-07-14 DIAGNOSIS — R0609 Other forms of dyspnea: Secondary | ICD-10-CM

## 2012-07-14 DIAGNOSIS — R0602 Shortness of breath: Secondary | ICD-10-CM | POA: Insufficient documentation

## 2012-07-14 DIAGNOSIS — J988 Other specified respiratory disorders: Secondary | ICD-10-CM | POA: Diagnosis not present

## 2012-07-14 DIAGNOSIS — R0600 Dyspnea, unspecified: Secondary | ICD-10-CM

## 2012-07-14 DIAGNOSIS — R059 Cough, unspecified: Secondary | ICD-10-CM | POA: Diagnosis not present

## 2012-07-14 DIAGNOSIS — R0989 Other specified symptoms and signs involving the circulatory and respiratory systems: Secondary | ICD-10-CM | POA: Diagnosis not present

## 2012-07-14 MED ORDER — ALBUTEROL SULFATE HFA 108 (90 BASE) MCG/ACT IN AERS: 2.0000 | INHALATION_SPRAY | RESPIRATORY_TRACT | Status: DC | PRN

## 2012-07-14 MED ORDER — ALBUTEROL SULFATE (2.5 MG/3ML) 0.083% IN NEBU
2.5000 mg | INHALATION_SOLUTION | Freq: Once | RESPIRATORY_TRACT | Status: AC
  Administered 2012-07-14: 2.5 mg via RESPIRATORY_TRACT

## 2012-07-14 MED ORDER — AZITHROMYCIN 250 MG PO TABS: ORAL_TABLET | ORAL | Status: DC

## 2012-07-15 DIAGNOSIS — J45901 Unspecified asthma with (acute) exacerbation: Secondary | ICD-10-CM | POA: Insufficient documentation

## 2012-07-15 NOTE — Progress Notes (Signed)
Subjective:  Presents with continued complaints of cough and shortness of breath. Frequent cough. Producing a small amount of white mucus at times. Slight chest tightness with deep breath or cough. No wheezing. Has not used her inhaler. No edema. No orthopnea. No runny nose ear pain or sore throat. No headache. PMH asthma. Last seen on 4/7. Minimal improvement on amoxicillin.  Objective:   BP 130/70  Temp(Src) 98.1 F (36.7 C) (Oral)  Wt 156 lb (70.761 kg)  BMI 24.43 kg/m2  SpO2 98% NAD. Alert, oriented. TMs clear effusion, no erythema. Pharynx mildly erythematous with PND noted. Neck supple with mild soft nontender adenopathy. Lungs initially mildly diminished breath sounds in general. No wheezing. Given albuterol 2.5 mg neb treatment. Airflow much improved with subjective improvement of symptoms. O2 sat 98% on room air. Heart regular rate rhythm.  Assessment:Asthma with acute exacerbation - Plan: albuterol (PROVENTIL) (2.5 MG/3ML) 0.083% nebulizer solution 2.5 mg, DG Chest 2 View  Dyspnea  Plan: Meds ordered this encounter  Medications  . albuterol (PROVENTIL) (2.5 MG/3ML) 0.083% nebulizer solution 2.5 mg    Sig:   . azithromycin (ZITHROMAX Z-PAK) 250 MG tablet    Sig: Take 2 tablets (500 mg) on  Day 1,  followed by 1 tablet (250 mg) once daily on Days 2 through 5.    Dispense:  6 each    Refill:  0    Order Specific Question:  Supervising Provider    Answer:  Merlyn Albert [[id number]]  . albuterol (PROVENTIL HFA;VENTOLIN HFA) 108 (90 BASE) MCG/ACT inhaler    Sig: Inhale 2 puffs into the lungs every 4 (four) hours as needed for wheezing.    Dispense:  1 Inhaler    Refill:  2    Order Specific Question:  Supervising Provider    Answer:  Merlyn Albert [[id number]]   obtain chest x-ray. OTC meds as directed for congestion and cough. Warning signs reviewed. Call back in 4-5 days if no improvement, call or go to ED sooner if worse.

## 2012-07-15 NOTE — Assessment & Plan Note (Signed)
Albuterol inhaler as directed when necessary. Z-Pak as directed.

## 2012-08-02 DIAGNOSIS — H2510 Age-related nuclear cataract, unspecified eye: Secondary | ICD-10-CM | POA: Diagnosis not present

## 2012-08-02 DIAGNOSIS — Z538 Procedure and treatment not carried out for other reasons: Secondary | ICD-10-CM | POA: Diagnosis not present

## 2012-09-01 DIAGNOSIS — H2510 Age-related nuclear cataract, unspecified eye: Secondary | ICD-10-CM | POA: Diagnosis not present

## 2012-09-01 DIAGNOSIS — G4700 Insomnia, unspecified: Secondary | ICD-10-CM | POA: Diagnosis not present

## 2012-09-01 DIAGNOSIS — Z79899 Other long term (current) drug therapy: Secondary | ICD-10-CM | POA: Diagnosis not present

## 2012-09-01 DIAGNOSIS — R42 Dizziness and giddiness: Secondary | ICD-10-CM | POA: Diagnosis not present

## 2012-09-01 DIAGNOSIS — H2589 Other age-related cataract: Secondary | ICD-10-CM | POA: Diagnosis not present

## 2012-09-01 DIAGNOSIS — H4011X0 Primary open-angle glaucoma, stage unspecified: Secondary | ICD-10-CM | POA: Diagnosis not present

## 2012-09-01 DIAGNOSIS — Z9849 Cataract extraction status, unspecified eye: Secondary | ICD-10-CM | POA: Diagnosis not present

## 2012-09-01 DIAGNOSIS — K5900 Constipation, unspecified: Secondary | ICD-10-CM | POA: Diagnosis not present

## 2012-09-01 DIAGNOSIS — Z961 Presence of intraocular lens: Secondary | ICD-10-CM | POA: Diagnosis not present

## 2012-09-01 DIAGNOSIS — H4010X0 Unspecified open-angle glaucoma, stage unspecified: Secondary | ICD-10-CM | POA: Diagnosis not present

## 2012-09-01 DIAGNOSIS — E039 Hypothyroidism, unspecified: Secondary | ICD-10-CM | POA: Diagnosis not present

## 2012-09-01 DIAGNOSIS — H409 Unspecified glaucoma: Secondary | ICD-10-CM | POA: Diagnosis not present

## 2012-09-01 DIAGNOSIS — Z7982 Long term (current) use of aspirin: Secondary | ICD-10-CM | POA: Diagnosis not present

## 2012-09-01 DIAGNOSIS — Z87891 Personal history of nicotine dependence: Secondary | ICD-10-CM | POA: Diagnosis not present

## 2012-09-02 DIAGNOSIS — Z9889 Other specified postprocedural states: Secondary | ICD-10-CM | POA: Diagnosis not present

## 2012-09-02 DIAGNOSIS — Z9849 Cataract extraction status, unspecified eye: Secondary | ICD-10-CM | POA: Diagnosis not present

## 2012-09-27 DIAGNOSIS — Z961 Presence of intraocular lens: Secondary | ICD-10-CM | POA: Diagnosis not present

## 2012-09-27 DIAGNOSIS — H4010X0 Unspecified open-angle glaucoma, stage unspecified: Secondary | ICD-10-CM | POA: Diagnosis not present

## 2012-09-27 DIAGNOSIS — H409 Unspecified glaucoma: Secondary | ICD-10-CM | POA: Diagnosis not present

## 2012-09-27 DIAGNOSIS — H2510 Age-related nuclear cataract, unspecified eye: Secondary | ICD-10-CM | POA: Diagnosis not present

## 2012-11-29 DIAGNOSIS — H409 Unspecified glaucoma: Secondary | ICD-10-CM | POA: Diagnosis not present

## 2012-11-29 DIAGNOSIS — H2510 Age-related nuclear cataract, unspecified eye: Secondary | ICD-10-CM | POA: Diagnosis not present

## 2012-11-29 DIAGNOSIS — H4010X0 Unspecified open-angle glaucoma, stage unspecified: Secondary | ICD-10-CM | POA: Diagnosis not present

## 2012-11-29 DIAGNOSIS — Z961 Presence of intraocular lens: Secondary | ICD-10-CM | POA: Diagnosis not present

## 2012-12-15 ENCOUNTER — Ambulatory Visit: Payer: No Typology Code available for payment source | Admitting: Family Medicine

## 2012-12-19 ENCOUNTER — Encounter: Payer: Self-pay | Admitting: Family Medicine

## 2012-12-19 ENCOUNTER — Ambulatory Visit (INDEPENDENT_AMBULATORY_CARE_PROVIDER_SITE_OTHER): Payer: Medicare Other | Admitting: Family Medicine

## 2012-12-19 ENCOUNTER — Ambulatory Visit (HOSPITAL_COMMUNITY)
Admission: RE | Admit: 2012-12-19 | Discharge: 2012-12-19 | Disposition: A | Discharge status: Home / Self Care | Payer: Medicare Other | Source: Ambulatory Visit | Attending: Family Medicine | Admitting: Family Medicine

## 2012-12-19 VITALS — BP 144/80 | Ht 68.0 in | Wt 160.0 lb

## 2012-12-19 DIAGNOSIS — E782 Mixed hyperlipidemia: Secondary | ICD-10-CM | POA: Diagnosis not present

## 2012-12-19 DIAGNOSIS — E039 Hypothyroidism, unspecified: Secondary | ICD-10-CM

## 2012-12-19 DIAGNOSIS — M25539 Pain in unspecified wrist: Secondary | ICD-10-CM | POA: Insufficient documentation

## 2012-12-19 DIAGNOSIS — S52539A Colles' fracture of unspecified radius, initial encounter for closed fracture: Secondary | ICD-10-CM | POA: Insufficient documentation

## 2012-12-19 DIAGNOSIS — Z23 Encounter for immunization: Secondary | ICD-10-CM | POA: Diagnosis not present

## 2012-12-19 DIAGNOSIS — R413 Other amnesia: Secondary | ICD-10-CM

## 2012-12-19 DIAGNOSIS — M25531 Pain in right wrist: Secondary | ICD-10-CM

## 2012-12-19 DIAGNOSIS — E785 Hyperlipidemia, unspecified: Secondary | ICD-10-CM | POA: Diagnosis not present

## 2012-12-19 DIAGNOSIS — S52599A Other fractures of lower end of unspecified radius, initial encounter for closed fracture: Secondary | ICD-10-CM | POA: Diagnosis not present

## 2012-12-19 DIAGNOSIS — W19XXXA Unspecified fall, initial encounter: Secondary | ICD-10-CM | POA: Insufficient documentation

## 2012-12-19 NOTE — Progress Notes (Signed)
  Subjective:    Patient ID: Tammy Mosley, female    DOB: [birth year], 79 y.o.   MRN: [id number]  HPIFell off porch last week and hurt right arm. Took a tumble off of the porch,fell backwards. Wk ago sat.  Thumb better today, sore last wk.  Leaves light on and doors shut, constantly misplacing stuff, trouble remembering names  Patient appears to be less concerned about her memory difficulties than her husband. He is more concerned. There is a strong family history of dementia. The patient's older sister has dementia.  Husband gives multiple examples of how the patient's short-term memory is not doing nearly as well as before.  Patient claims compliance with her thyroid medication. This spring however her thyroid was not in good control. On further history she admits to not lose remembering when to take the thyroid medication out of the bottle.    Requesting flu vaccine.   Memory loss. MMSE score 27/30.    Review of Systems No chest pain no back pain no abdominal pain no change in bowel habits    Objective:   Physical Exam  Alert pleasant no acute distress oriented x3 lungs clear heart regular in rhythm wrists distinctly tender ankles without edema. Right wrist distinctly tender some swelling. TSH this spring was elevated in the mid 20s.      Assessment & Plan:  Impression acute wrist injury with need for further analysis. #2 hypothyroidism wondered if related to #3. Needs repeat blood work. #3 excess of short-term memory loss. Not enough on the MMSE to warrant a dementia workup at this time. However this may change in the future discussed at great length. Plan appropriate blood work. Right wrist x-ray. Hold off on 2 dementia workup recheck as scheduled. 35-40 minutes spet most in discussion. WSL

## 2012-12-20 ENCOUNTER — Other Ambulatory Visit: Payer: Self-pay | Admitting: *Deleted

## 2012-12-20 DIAGNOSIS — E782 Mixed hyperlipidemia: Secondary | ICD-10-CM | POA: Diagnosis not present

## 2012-12-20 DIAGNOSIS — S62101A Fracture of unspecified carpal bone, right wrist, initial encounter for closed fracture: Secondary | ICD-10-CM

## 2012-12-20 DIAGNOSIS — R413 Other amnesia: Secondary | ICD-10-CM | POA: Insufficient documentation

## 2012-12-20 DIAGNOSIS — E039 Hypothyroidism, unspecified: Secondary | ICD-10-CM | POA: Diagnosis not present

## 2012-12-20 LAB — LIPID PANEL
Cholesterol: 250 mg/dL — ABNORMAL HIGH (ref 0–200)
LDL Cholesterol: 182 mg/dL — ABNORMAL HIGH (ref 0–99)

## 2012-12-21 ENCOUNTER — Encounter: Payer: Self-pay | Admitting: Orthopedic Surgery

## 2012-12-21 ENCOUNTER — Ambulatory Visit (INDEPENDENT_AMBULATORY_CARE_PROVIDER_SITE_OTHER): Payer: Medicare Other | Admitting: Orthopedic Surgery

## 2012-12-21 VITALS — BP 127/65 | Ht 68.0 in | Wt 157.0 lb

## 2012-12-21 DIAGNOSIS — S62109A Fracture of unspecified carpal bone, unspecified wrist, initial encounter for closed fracture: Secondary | ICD-10-CM | POA: Diagnosis not present

## 2012-12-21 DIAGNOSIS — S62101A Fracture of unspecified carpal bone, right wrist, initial encounter for closed fracture: Secondary | ICD-10-CM

## 2012-12-21 NOTE — Patient Instructions (Signed)
Brace x 6 weeks 

## 2012-12-21 NOTE — Progress Notes (Signed)
  Subjective:    Patient ID: Tammy Mosley, female    DOB: [birth year], 79 y.o.   MRN: [id number]  Chief Complaint  Patient presents with  . Wrist Pain    Right wrist fracture d/t injury 12/09/12. Referred by Dr. Lubertha South    Wrist Pain    patient presents after falling off of a porch on September 27. Complains of pain in the wrist which got worse so she went to the emergency room. She now has sharp and dull pain 6/10 comes and goes seems to be worse with use and she does note swelling and bruising   She does note to have an overactive thyroid  No allergies  She had a complete hysterectomy  Should great toe surgery bilaterally  She has a family history of heart disease lung disease cancer and diabetes she is currently retired    Review of Systems Review of systems renal normal findings except for some watering in her left is some constipation unsteady gait and seasonal allergies     Objective:   Physical Exam  Nursing note and vitals reviewed. Constitutional: She is oriented to person, place, and time. She appears well-developed and well-nourished. No distress.  HENT:  Head: Normocephalic.  Eyes: Right eye exhibits no discharge. Left eye exhibits no discharge.  Neck: No tracheal deviation present.  Cardiovascular: Intact distal pulses.   Lymphadenopathy:       Right: No epitrochlear adenopathy present.       Left: No epitrochlear adenopathy present.  Neurological: She is alert and oriented to person, place, and time. She exhibits normal muscle tone.  Skin: Skin is warm and dry. No rash noted. She is not diaphoretic. No erythema.  Psychiatric: She has a normal mood and affect. Her behavior is normal. Judgment and thought content normal.   right wrist no deformity tenderness over the distal radius Range of motion remains normal but painful including fingers and wrist joint elbow joint nontender shoulder nontender Muscle tone is normal without atrophy or tremor Grip  strength is normal  The x-ray showed an intra-articular nondisplaced distal radius fracture with no angulation or displacement  Patient has opted for brace treatment  Return 6 weeks for x-ray        Assessment & Plan:

## 2013-01-05 ENCOUNTER — Ambulatory Visit (INDEPENDENT_AMBULATORY_CARE_PROVIDER_SITE_OTHER): Payer: Medicare Other | Admitting: Advanced Practice Midwife

## 2013-01-05 ENCOUNTER — Other Ambulatory Visit: Payer: Self-pay | Admitting: Advanced Practice Midwife

## 2013-01-05 ENCOUNTER — Encounter: Payer: Self-pay | Admitting: Advanced Practice Midwife

## 2013-01-05 VITALS — BP 150/102 | Ht 68.0 in | Wt 157.0 lb

## 2013-01-05 DIAGNOSIS — N63 Unspecified lump in unspecified breast: Secondary | ICD-10-CM | POA: Diagnosis not present

## 2013-01-05 DIAGNOSIS — N6310 Unspecified lump in the right breast, unspecified quadrant: Secondary | ICD-10-CM

## 2013-01-05 NOTE — Progress Notes (Signed)
Tammy Mosley Profit 79 y.o.  HPI:  Found a tender lump in right breast about 2 weeks ago.  Has a nodule above it that has been there "for a while".  Last mammogram 7/12.  Past Medical History  Diagnosis Date  . Ovarian cancer 1999  . Constipation   . Hypothyroidism   . Glaucoma   . Cataracts, bilateral   . S/P colonoscopy 2004    Dr. Michaelyn Barter, [facility], diverticulosis and hemorrhoids  . Asthma   . Frequent PVCs   . Insomnia    Past Surgical History  Procedure Laterality Date  . Thyroid surgery    . Total vaginal hysterectomy      due to ovarian cancer  . Hernia repair      X 2  . Breast surgery      calcification removal, L breast  . Toe surgery      toes on both feet straightened  . Eye surgery     Current outpatient prescriptions:albuterol (PROVENTIL HFA;VENTOLIN HFA) 108 (90 BASE) MCG/ACT inhaler, Inhale 2 puffs into the lungs every 4 (four) hours as needed for wheezing., Disp: 1 Inhaler, Rfl: 2;  aspirin 81 MG tablet, Take 81 mg by mouth daily., Disp: , Rfl: ;  brinzolamide (AZOPT) 1 % ophthalmic suspension, Place 1 drop into both eyes 2 (two) times daily., Disp: , Rfl:  Calcium Carbonate-Vitamin Mosley (CALCIUM + Mosley PO), Take by mouth., Disp: , Rfl: ;  dorzolamide (TRUSOPT) 2 % ophthalmic solution, Place 1 drop into both eyes 2 (two) times daily.  , Disp: , Rfl: ;  ibuprofen (ADVIL,MOTRIN) 200 MG tablet, Take 200 mg by mouth every 6 (six) hours as needed for pain., Disp: , Rfl: ;  levothyroxine (SYNTHROID) 137 MCG tablet, Take 137 mcg by mouth daily before breakfast., Disp: , Rfl:  Probiotic Product (PROBIOTIC DAILY PO), Take by mouth., Disp: , Rfl:   PHYSICAL EXAM:  Left breast:  Breasts: breasts appear normal, no suspicious masses, no skin or nipple changes or axillary nodes  Right Breast:1.5cm firm, erythemous, slightly tender area at 3 oclock, feels superficial.  Has a firm, pea sized nodule superior to this area  ASSESSMENT:  Abnormal nodule (s) right breast.  Possibly cystic  PLAN:   Diagnostic mammogram/u/s ordered.  Pt and husband counseled to return to [facility] if area becomes inflamed, painful, or feverish .

## 2013-01-09 ENCOUNTER — Other Ambulatory Visit: Payer: Self-pay | Admitting: *Deleted

## 2013-01-09 MED ORDER — LEVOTHYROXINE SODIUM 150 MCG PO TABS: 150.0000 ug | ORAL_TABLET | Freq: Every day | ORAL | Status: DC

## 2013-01-18 ENCOUNTER — Other Ambulatory Visit: Payer: Self-pay | Admitting: Advanced Practice Midwife

## 2013-01-18 ENCOUNTER — Ambulatory Visit (HOSPITAL_COMMUNITY)
Admission: RE | Admit: 2013-01-18 | Discharge: 2013-01-18 | Disposition: A | Discharge status: Home / Self Care | Payer: Medicare Other | Source: Ambulatory Visit | Attending: Advanced Practice Midwife | Admitting: Advanced Practice Midwife

## 2013-01-18 DIAGNOSIS — N63 Unspecified lump in unspecified breast: Secondary | ICD-10-CM

## 2013-01-18 NOTE — Progress Notes (Signed)
Let's call pt back. I personally think she is forgetting to take her med. But let's go aheafd and increase levothyroxine to 175 mcg daily, te tsh in three months

## 2013-01-19 ENCOUNTER — Encounter: Payer: Self-pay | Admitting: Family Medicine

## 2013-01-19 ENCOUNTER — Ambulatory Visit (INDEPENDENT_AMBULATORY_CARE_PROVIDER_SITE_OTHER): Payer: Medicare Other | Admitting: Family Medicine

## 2013-01-19 VITALS — BP 130/90 | Ht 68.0 in | Wt 156.0 lb

## 2013-01-19 DIAGNOSIS — E039 Hypothyroidism, unspecified: Secondary | ICD-10-CM

## 2013-01-19 MED ORDER — LEVOTHYROXINE SODIUM 175 MCG PO TABS: 175.0000 ug | ORAL_TABLET | Freq: Every day | ORAL | Status: DC

## 2013-01-19 NOTE — Progress Notes (Signed)
  Subjective:    Patient ID: Tammy Mosley, female    DOB: 11-Apr-1928, 79 y.o.   MRN: [id number]  HPI Patient is here today for a recheck on her hypothyroid.   She states she takes her Synthroid everyday.  Pt is scheduled to get surgery to remove a lump from her breast. Patient naturally somewhat anxious about this.  On further history patient claims that she has been compliant with her thyroid medication. Recent TSH was quite high. Does note some fatigue and tiredness. Feels cold at times. Has some constipation.    Review of Systems No chest pain no headache no back pain ROS otherwise negative    Objective:   Physical Exam  Alert no apparent distress. HEENT thyroid nonpalpable. Lungs clear. Heart regular in rhythm. Abdomen benign. Extremities without edema      Assessment & Plan:  Impression 1 hypothyroidism discussed may be contributing to patient's early dementia symptomatology plan strength of thyroid medicine increase. Rationale discussed. Recheck in several months. TSH before then. Warning signs discussed. WSL possible

## 2013-01-19 NOTE — Patient Instructions (Signed)
Please call us a week before the follow up visit so we can do thyroid blood work

## 2013-01-25 ENCOUNTER — Encounter (HOSPITAL_COMMUNITY): Payer: Self-pay

## 2013-01-25 ENCOUNTER — Other Ambulatory Visit: Payer: Self-pay | Admitting: Advanced Practice Midwife

## 2013-01-25 ENCOUNTER — Ambulatory Visit (HOSPITAL_COMMUNITY)
Admission: RE | Admit: 2013-01-25 | Discharge: 2013-01-25 | Disposition: A | Discharge status: Home / Self Care | Payer: Medicare Other | Source: Ambulatory Visit | Attending: Advanced Practice Midwife | Admitting: Advanced Practice Midwife

## 2013-01-25 VITALS — BP 164/86 | HR 68 | Temp 97.8°F | Resp 16

## 2013-01-25 DIAGNOSIS — N6310 Unspecified lump in the right breast, unspecified quadrant: Secondary | ICD-10-CM

## 2013-01-25 DIAGNOSIS — N63 Unspecified lump in unspecified breast: Secondary | ICD-10-CM | POA: Insufficient documentation

## 2013-01-25 DIAGNOSIS — L723 Sebaceous cyst: Secondary | ICD-10-CM | POA: Insufficient documentation

## 2013-01-25 DIAGNOSIS — R928 Other abnormal and inconclusive findings on diagnostic imaging of breast: Secondary | ICD-10-CM | POA: Diagnosis not present

## 2013-01-25 DIAGNOSIS — R599 Enlarged lymph nodes, unspecified: Secondary | ICD-10-CM | POA: Diagnosis not present

## 2013-01-25 DIAGNOSIS — N6009 Solitary cyst of unspecified breast: Secondary | ICD-10-CM | POA: Insufficient documentation

## 2013-01-25 MED ORDER — LIDOCAINE HCL (PF) 2 % IJ SOLN
10.0000 mL | Freq: Once | INTRAMUSCULAR | Status: AC
  Administered 2013-01-25: 10 mL

## 2013-01-25 MED ORDER — LIDOCAINE HCL (PF) 2 % IJ SOLN
INTRAMUSCULAR | Status: AC
  Administered 2013-01-25: 10 mL
  Filled 2013-01-25: qty 20

## 2013-01-25 NOTE — Progress Notes (Signed)
Biopsy complete no signs of distress  

## 2013-01-27 ENCOUNTER — Telehealth: Payer: Self-pay | Admitting: Advanced Practice Midwife

## 2013-01-31 ENCOUNTER — Encounter: Payer: Self-pay | Admitting: *Deleted

## 2013-01-31 ENCOUNTER — Other Ambulatory Visit: Payer: Self-pay | Admitting: Advanced Practice Midwife

## 2013-01-31 MED ORDER — SULFAMETHOXAZOLE-TMP DS 800-160 MG PO TABS: 1.0000 | ORAL_TABLET | Freq: Two times a day (BID) | ORAL | Status: DC

## 2013-01-31 NOTE — Telephone Encounter (Signed)
Per Rodena Piety, CNM spoke with pt and discussed breast biopsy results.

## 2013-02-02 ENCOUNTER — Ambulatory Visit (INDEPENDENT_AMBULATORY_CARE_PROVIDER_SITE_OTHER): Payer: Self-pay | Admitting: Orthopedic Surgery

## 2013-02-02 ENCOUNTER — Encounter: Payer: Self-pay | Admitting: Orthopedic Surgery

## 2013-02-02 ENCOUNTER — Ambulatory Visit (INDEPENDENT_AMBULATORY_CARE_PROVIDER_SITE_OTHER): Payer: Medicare Other

## 2013-02-02 VITALS — BP 126/72 | Ht 68.0 in | Wt 157.0 lb

## 2013-02-02 DIAGNOSIS — S52531A Colles' fracture of right radius, initial encounter for closed fracture: Secondary | ICD-10-CM | POA: Insufficient documentation

## 2013-02-02 DIAGNOSIS — S5290XD Unspecified fracture of unspecified forearm, subsequent encounter for closed fracture with routine healing: Secondary | ICD-10-CM

## 2013-02-02 DIAGNOSIS — S52531D Colles' fracture of right radius, subsequent encounter for closed fracture with routine healing: Secondary | ICD-10-CM

## 2013-02-02 NOTE — Progress Notes (Signed)
Patient ID: Tammy Mosley, female   DOB: 07-14-1928, 79 y.o.   MRN: [id number] Right wrist fracture followup x-ray status post bracing at the patient's choice for 6 weeks  Complains of some soreness  X-ray shows settling of the fracture shortening with prominent ulna patient made aware not concerned followup as needed

## 2013-02-02 NOTE — Patient Instructions (Signed)
You can do activities as tolerated

## 2013-02-28 DIAGNOSIS — H04129 Dry eye syndrome of unspecified lacrimal gland: Secondary | ICD-10-CM | POA: Diagnosis not present

## 2013-02-28 DIAGNOSIS — H2000 Unspecified acute and subacute iridocyclitis: Secondary | ICD-10-CM | POA: Diagnosis not present

## 2013-02-28 DIAGNOSIS — H4010X0 Unspecified open-angle glaucoma, stage unspecified: Secondary | ICD-10-CM | POA: Diagnosis not present

## 2013-03-01 ENCOUNTER — Encounter: Payer: Self-pay | Admitting: Family Medicine

## 2013-03-01 ENCOUNTER — Ambulatory Visit (INDEPENDENT_AMBULATORY_CARE_PROVIDER_SITE_OTHER): Payer: Medicare Other | Admitting: Family Medicine

## 2013-03-01 VITALS — BP 122/64 | Temp 98.3°F | Ht 68.0 in | Wt 148.4 lb

## 2013-03-01 DIAGNOSIS — H109 Unspecified conjunctivitis: Secondary | ICD-10-CM | POA: Diagnosis not present

## 2013-03-01 DIAGNOSIS — E039 Hypothyroidism, unspecified: Secondary | ICD-10-CM | POA: Diagnosis not present

## 2013-03-01 DIAGNOSIS — R6889 Other general symptoms and signs: Secondary | ICD-10-CM

## 2013-03-01 NOTE — Progress Notes (Signed)
   Subjective:    Patient ID: Tammy Mosley, female    DOB: 07/06/1928, 79 y.o.   MRN: [id number]  HPI Patient is here today due to stinging in her eyes. She said they are red and itchy too. She went to her eye doctor yesterday and they referred her here. Also worried about forgetfulness Patient passed the MMSE pmh reviewed  fmhx-dementia Review of Systems  Constitutional: Negative for fever and fatigue.  HENT: Negative for congestion.   Eyes: Positive for redness and itching.  Respiratory: Negative for cough and shortness of breath.   Cardiovascular: Negative for chest pain.       Objective:   Physical Exam  Constitutional: She appears well-developed and well-nourished.  HENT:  Head: Normocephalic.  Mouth/Throat: No oropharyngeal exudate.  Cardiovascular: Normal rate, regular rhythm and normal heart sounds.   No murmur heard. Pulmonary/Chest: Effort normal and breath sounds normal. No respiratory distress. She has no wheezes.  Musculoskeletal: She exhibits no edema.  Skin: She is not diaphoretic.     25 minutes to address all issues     Assessment & Plan:  conjunc cont drops, f/u with otho, run test Memory, recheck TSH, failed mini cog, passed MMSE poss early dementia

## 2013-03-02 LAB — ANA: Anti Nuclear Antibody(ANA): NEGATIVE

## 2013-03-07 ENCOUNTER — Ambulatory Visit: Payer: Medicare Other | Admitting: Family Medicine

## 2013-04-11 ENCOUNTER — Ambulatory Visit: Payer: Medicare Other | Admitting: Family Medicine

## 2013-04-24 ENCOUNTER — Ambulatory Visit: Payer: Medicare Other | Admitting: Family Medicine

## 2013-04-24 ENCOUNTER — Encounter: Payer: Self-pay | Admitting: Family Medicine

## 2013-04-24 ENCOUNTER — Ambulatory Visit (INDEPENDENT_AMBULATORY_CARE_PROVIDER_SITE_OTHER): Payer: Medicare Other | Admitting: Family Medicine

## 2013-04-24 VITALS — BP 128/88 | Ht 68.0 in | Wt 151.6 lb

## 2013-04-24 DIAGNOSIS — R413 Other amnesia: Secondary | ICD-10-CM | POA: Diagnosis not present

## 2013-04-24 DIAGNOSIS — R4182 Altered mental status, unspecified: Secondary | ICD-10-CM | POA: Diagnosis not present

## 2013-04-24 DIAGNOSIS — E785 Hyperlipidemia, unspecified: Secondary | ICD-10-CM

## 2013-04-24 DIAGNOSIS — Z79899 Other long term (current) drug therapy: Secondary | ICD-10-CM | POA: Diagnosis not present

## 2013-04-24 DIAGNOSIS — D539 Nutritional anemia, unspecified: Secondary | ICD-10-CM | POA: Diagnosis not present

## 2013-04-24 NOTE — Progress Notes (Signed)
   Subjective:    Patient ID: Tammy Mosley, female    DOB: Feb 21, 1929, 80 y.o.   MRN: [id number]  HPI  Patient arrives for a follow up on thyroid and her wrist.  Results for orders placed in visit on 03/01/13  TSH      Result Value Range   TSH 36.725 (*) 0.350 - 4.500 uIU/mL  SEDIMENTATION RATE      Result Value Range   Sed Rate 18  0 - 22 mm/hr  ANA      Result Value Range   ANA NEG  NEGATIVE   Patient reports no mew problems or concerns.  Wrist feels better. Saw an Dr. Aline Brochure for this. Overall healing well.  Pt notes conmpliant with thyr med. Does not think that she has missed many dosages.  Still notes considerable difficulty with short-term memory. Patient's spouse had expressed significant concerns me in this regard. Wonders if she can take a medication for this.  Patient reports wheezing and shortness of breath overall stable at this point.  Patient reports no longer driving Review of Systems No headache no chest pain no back pain no loss of consciousness no seizures no blood in stools ROS otherwise negative    Objective:   Physical Exam Alert no apparent distress. Thyroid nonpalpable. Neck supple. Lungs clear. Heart regular rate and rhythm. Wrist minimal tenderness to palpation.  Patient unable to remember 3 items for several minutes. Patient had significant difficulty with [place]. She was not even sure of which month this is.       Assessment & Plan:  Impression probable early dementia and discussed. #2 hypothyroidism status uncertain. Plan CT scan of the head. Appropriate blood work. Followup after results in. Importance of compliance with medications we emphasized. WSL

## 2013-04-25 LAB — VITAMIN B12: Vitamin B-12: 567 pg/mL (ref 211–911)

## 2013-04-25 LAB — FOLATE: Folate: 10.2 ng/mL

## 2013-04-25 LAB — TSH: TSH: 54.268 u[IU]/mL — ABNORMAL HIGH (ref 0.350–4.500)

## 2013-04-27 ENCOUNTER — Other Ambulatory Visit: Payer: Self-pay

## 2013-04-27 MED ORDER — LEVOTHYROXINE SODIUM 200 MCG PO TABS: 200.0000 ug | ORAL_TABLET | Freq: Every day | ORAL | Status: DC

## 2013-04-28 ENCOUNTER — Ambulatory Visit (HOSPITAL_COMMUNITY): Admission: RE | Admit: 2013-04-28 | Payer: Medicare Other | Source: Ambulatory Visit

## 2013-04-28 ENCOUNTER — Ambulatory Visit (HOSPITAL_COMMUNITY)
Admission: RE | Admit: 2013-04-28 | Discharge: 2013-04-28 | Disposition: A | Discharge status: Home / Self Care | Payer: Medicare Other | Source: Ambulatory Visit | Attending: Family Medicine | Admitting: Family Medicine

## 2013-04-28 DIAGNOSIS — F0390 Unspecified dementia without behavioral disturbance: Secondary | ICD-10-CM | POA: Diagnosis not present

## 2013-04-28 DIAGNOSIS — G319 Degenerative disease of nervous system, unspecified: Secondary | ICD-10-CM | POA: Insufficient documentation

## 2013-04-28 DIAGNOSIS — I6789 Other cerebrovascular disease: Secondary | ICD-10-CM | POA: Diagnosis not present

## 2013-07-11 DIAGNOSIS — H409 Unspecified glaucoma: Secondary | ICD-10-CM | POA: Diagnosis not present

## 2013-07-11 DIAGNOSIS — H4010X0 Unspecified open-angle glaucoma, stage unspecified: Secondary | ICD-10-CM | POA: Diagnosis not present

## 2013-07-11 DIAGNOSIS — Z961 Presence of intraocular lens: Secondary | ICD-10-CM | POA: Diagnosis not present

## 2013-07-11 DIAGNOSIS — H2000 Unspecified acute and subacute iridocyclitis: Secondary | ICD-10-CM | POA: Diagnosis not present

## 2013-10-10 DIAGNOSIS — B009 Herpesviral infection, unspecified: Secondary | ICD-10-CM | POA: Diagnosis not present

## 2013-10-10 DIAGNOSIS — H44119 Panuveitis, unspecified eye: Secondary | ICD-10-CM | POA: Diagnosis not present

## 2013-10-10 DIAGNOSIS — H02409 Unspecified ptosis of unspecified eyelid: Secondary | ICD-10-CM | POA: Diagnosis not present

## 2013-10-11 ENCOUNTER — Ambulatory Visit (INDEPENDENT_AMBULATORY_CARE_PROVIDER_SITE_OTHER): Payer: Medicare Other | Admitting: Family Medicine

## 2013-10-11 ENCOUNTER — Encounter: Payer: Self-pay | Admitting: Family Medicine

## 2013-10-11 VITALS — BP 132/70 | Ht 67.0 in | Wt 145.0 lb

## 2013-10-11 DIAGNOSIS — E782 Mixed hyperlipidemia: Secondary | ICD-10-CM

## 2013-10-11 DIAGNOSIS — F0280 Dementia in other diseases classified elsewhere without behavioral disturbance: Secondary | ICD-10-CM | POA: Diagnosis not present

## 2013-10-11 DIAGNOSIS — E039 Hypothyroidism, unspecified: Secondary | ICD-10-CM | POA: Diagnosis not present

## 2013-10-11 DIAGNOSIS — Z79899 Other long term (current) drug therapy: Secondary | ICD-10-CM

## 2013-10-11 DIAGNOSIS — G309 Alzheimer's disease, unspecified: Secondary | ICD-10-CM | POA: Diagnosis not present

## 2013-10-11 LAB — BASIC METABOLIC PANEL
BUN: 19 mg/dL (ref 6–23)
CHLORIDE: 102 meq/L (ref 96–112)
CO2: 29 meq/L (ref 19–32)
Calcium: 9.1 mg/dL (ref 8.4–10.5)
Creat: 0.86 mg/dL (ref 0.50–1.10)
Glucose, Bld: 90 mg/dL (ref 70–99)
Potassium: 4.3 mEq/L (ref 3.5–5.3)
SODIUM: 138 meq/L (ref 135–145)

## 2013-10-11 LAB — HEPATIC FUNCTION PANEL
ALBUMIN: 4 g/dL (ref 3.5–5.2)
ALT: 8 U/L (ref 0–35)
AST: 16 U/L (ref 0–37)
Alkaline Phosphatase: 75 U/L (ref 39–117)
BILIRUBIN TOTAL: 0.6 mg/dL (ref 0.2–1.2)
Bilirubin, Direct: 0.1 mg/dL (ref 0.0–0.3)
Indirect Bilirubin: 0.5 mg/dL (ref 0.2–1.2)
Total Protein: 7.1 g/dL (ref 6.0–8.3)

## 2013-10-11 LAB — LIPID PANEL
CHOL/HDL RATIO: 4.3 ratio
Cholesterol: 228 mg/dL — ABNORMAL HIGH (ref 0–200)
HDL: 53 mg/dL (ref >39)
LDL Cholesterol: 139 mg/dL — ABNORMAL HIGH (ref 0–99)
Triglycerides: 180 mg/dL — ABNORMAL HIGH (ref <150)
VLDL: 36 mg/dL (ref 0–40)

## 2013-10-11 LAB — TSH: TSH: 1.871 u[IU]/mL (ref 0.350–4.500)

## 2013-10-11 MED ORDER — DONEPEZIL HCL 5 MG PO TABS: 5.0000 mg | ORAL_TABLET | Freq: Every day | ORAL | Status: DC

## 2013-10-11 NOTE — Progress Notes (Signed)
   Subjective:    Patient ID: Tammy Mosley, female    DOB: 1928-05-26, 80 y.o.   MRN: [id number]  HPIFollow up on forgetfulness. Mini cog score 25/30.   Dizziness off and on for awhile. It occurs when standing quickly.  History of hypothyroidism. #Been elevated considerably. Compliance of concern. Patient claims she is taking her medicine.  History of progressive forgetfulness. See prior notes. Now at 25/30 on MMSE test.  Review of Systems No chest pain no headache no back pain no abdominal pain some dizziness no blood in stool ROS otherwise negative    Objective:   Physical Exam Alert no apparent distress HEENT normal. Lungs clear. Heart regular in rhythm. Neuro grossly intact. Short-term memory zero out of three remember. Oriented x3. See MMSE result       Assessment & Plan:  Impression 1 progressive dementia discussed at length #2 hypothyroidism status uncertain. #3 hyperlipidemia status uncertain plan appropriate blood work. Initiate Aricept 5 mg each bedtime. Recheck in several months. Diet exercise discussed in encourage. WSL

## 2013-10-16 ENCOUNTER — Encounter: Payer: Self-pay | Admitting: Family Medicine

## 2013-10-23 ENCOUNTER — Telehealth: Payer: Self-pay | Admitting: Family Medicine

## 2013-10-23 NOTE — Telephone Encounter (Signed)
Since great 6 mos

## 2013-10-23 NOTE — Telephone Encounter (Signed)
Patient would like to know when she is due to have thyroid checked again.

## 2013-10-23 NOTE — Telephone Encounter (Signed)
TCNA 

## 2013-10-23 NOTE — Telephone Encounter (Signed)
Last TSh on 10/11/13. Result was 1.871

## 2013-10-23 NOTE — Telephone Encounter (Signed)
Discussed with patient

## 2013-11-08 DIAGNOSIS — H209 Unspecified iridocyclitis: Secondary | ICD-10-CM | POA: Diagnosis not present

## 2013-11-08 DIAGNOSIS — J45909 Unspecified asthma, uncomplicated: Secondary | ICD-10-CM | POA: Diagnosis not present

## 2013-11-08 DIAGNOSIS — F0280 Dementia in other diseases classified elsewhere without behavioral disturbance: Secondary | ICD-10-CM | POA: Diagnosis not present

## 2013-11-08 DIAGNOSIS — Z90710 Acquired absence of both cervix and uterus: Secondary | ICD-10-CM | POA: Diagnosis not present

## 2013-11-08 DIAGNOSIS — H4011X0 Primary open-angle glaucoma, stage unspecified: Secondary | ICD-10-CM | POA: Diagnosis not present

## 2013-11-08 DIAGNOSIS — E039 Hypothyroidism, unspecified: Secondary | ICD-10-CM | POA: Diagnosis not present

## 2013-11-08 DIAGNOSIS — H02409 Unspecified ptosis of unspecified eyelid: Secondary | ICD-10-CM | POA: Diagnosis not present

## 2013-11-08 DIAGNOSIS — D8489 Other immunodeficiencies: Secondary | ICD-10-CM | POA: Diagnosis not present

## 2013-11-08 DIAGNOSIS — H44119 Panuveitis, unspecified eye: Secondary | ICD-10-CM | POA: Diagnosis not present

## 2013-11-08 DIAGNOSIS — H409 Unspecified glaucoma: Secondary | ICD-10-CM | POA: Diagnosis not present

## 2013-11-08 DIAGNOSIS — Z8543 Personal history of malignant neoplasm of ovary: Secondary | ICD-10-CM | POA: Diagnosis not present

## 2013-11-09 DIAGNOSIS — H44119 Panuveitis, unspecified eye: Secondary | ICD-10-CM | POA: Diagnosis not present

## 2013-11-09 DIAGNOSIS — D8489 Other immunodeficiencies: Secondary | ICD-10-CM | POA: Diagnosis not present

## 2013-11-09 DIAGNOSIS — B009 Herpesviral infection, unspecified: Secondary | ICD-10-CM | POA: Diagnosis not present

## 2013-11-09 DIAGNOSIS — Z87891 Personal history of nicotine dependence: Secondary | ICD-10-CM | POA: Diagnosis not present

## 2013-11-09 DIAGNOSIS — R609 Edema, unspecified: Secondary | ICD-10-CM | POA: Diagnosis not present

## 2013-11-09 DIAGNOSIS — H02409 Unspecified ptosis of unspecified eyelid: Secondary | ICD-10-CM | POA: Diagnosis not present

## 2013-11-10 ENCOUNTER — Other Ambulatory Visit: Payer: Self-pay | Admitting: Family Medicine

## 2014-01-16 ENCOUNTER — Ambulatory Visit: Payer: Medicare Other | Admitting: Family Medicine

## 2014-01-16 DIAGNOSIS — H18411 Arcus senilis, right eye: Secondary | ICD-10-CM | POA: Diagnosis not present

## 2014-01-16 DIAGNOSIS — Z961 Presence of intraocular lens: Secondary | ICD-10-CM | POA: Diagnosis not present

## 2014-01-16 DIAGNOSIS — H409 Unspecified glaucoma: Secondary | ICD-10-CM | POA: Diagnosis not present

## 2014-01-16 DIAGNOSIS — H18412 Arcus senilis, left eye: Secondary | ICD-10-CM | POA: Diagnosis not present

## 2014-01-24 ENCOUNTER — Encounter: Payer: Self-pay | Admitting: Family Medicine

## 2014-01-24 ENCOUNTER — Ambulatory Visit (INDEPENDENT_AMBULATORY_CARE_PROVIDER_SITE_OTHER): Payer: Medicare Other | Admitting: Family Medicine

## 2014-01-24 VITALS — BP 130/70 | Ht 67.0 in | Wt 141.5 lb

## 2014-01-24 DIAGNOSIS — E038 Other specified hypothyroidism: Secondary | ICD-10-CM

## 2014-01-24 DIAGNOSIS — G309 Alzheimer's disease, unspecified: Secondary | ICD-10-CM | POA: Diagnosis not present

## 2014-01-24 DIAGNOSIS — Z23 Encounter for immunization: Secondary | ICD-10-CM

## 2014-01-24 DIAGNOSIS — F0280 Dementia in other diseases classified elsewhere without behavioral disturbance: Secondary | ICD-10-CM

## 2014-01-24 MED ORDER — DONEPEZIL HCL 10 MG PO TABS: 10.0000 mg | ORAL_TABLET | Freq: Every day | ORAL | Status: DC

## 2014-01-24 NOTE — Progress Notes (Signed)
   Subjective:    Patient ID: Tammy Mosley, female    DOB: 30-Dec-1928, 80 y.o.   MRN: [id number]  HPI Patient is here today for her hypothyroidism follow up visit. Patient states that she is concerned about her memory. Patient states that when she arrived at the office today, she was not sure where she was at when she entered the parking lot.   Patient claims that her husband and children are being very supportive.  Compliant with thyroid medication. No symptoms of high or low thyroid. Right straight down each day when she takes the medicine.  Walking some.  Notes more difficulty with her memory. Family is also reporting considerable worsening of memory issues. See prior notes.   Patient states that she has no other concerns at this time.   Review of Systems    no headache no chest pain no back pain no abdominal pain no change in bowel habits no blood in stool Objective:   Physical Exam  Alert HEENT normal. Oriented 1. Lungs clear. Heart regular in rhythm. No tremor no focal neurological deficits thyroid nonpalpable      Assessment & Plan:  Impression 1 progressive dementia discussed with patient #2 hypothyroidism difficulty with compliance in the past family using improved methods now plan pneumonia vaccine. Maintain same thyroid. Increase Aricept to 10 mg. Exercise encourage. Follow-up as scheduled. WSL

## 2014-04-26 ENCOUNTER — Ambulatory Visit: Payer: Medicare Other | Admitting: Family Medicine

## 2014-05-02 ENCOUNTER — Other Ambulatory Visit: Payer: Self-pay | Admitting: Family Medicine

## 2014-05-03 ENCOUNTER — Other Ambulatory Visit: Payer: Self-pay | Admitting: Family Medicine

## 2014-05-03 NOTE — Telephone Encounter (Signed)
Pt last seen 01/24/14.   Pharmacy called wanting verification on levothyroxine. She has been getting 244mcg filled, but yesterday, 150 mcg was refilled.   Which one would you like for the pt to be on?  [place] [phone]

## 2014-05-03 NOTE — Telephone Encounter (Signed)
Dose is 200 need to eradicate old 150 rx from system

## 2014-05-10 DIAGNOSIS — H409 Unspecified glaucoma: Secondary | ICD-10-CM | POA: Diagnosis not present

## 2014-05-10 DIAGNOSIS — Z961 Presence of intraocular lens: Secondary | ICD-10-CM | POA: Diagnosis not present

## 2014-05-10 DIAGNOSIS — H26492 Other secondary cataract, left eye: Secondary | ICD-10-CM | POA: Diagnosis not present

## 2014-06-07 ENCOUNTER — Ambulatory Visit (INDEPENDENT_AMBULATORY_CARE_PROVIDER_SITE_OTHER): Payer: Medicare Other | Admitting: Family Medicine

## 2014-06-07 ENCOUNTER — Encounter: Payer: Self-pay | Admitting: Family Medicine

## 2014-06-07 VITALS — BP 122/80 | Temp 98.3°F | Ht 67.0 in | Wt 138.0 lb

## 2014-06-07 DIAGNOSIS — R05 Cough: Secondary | ICD-10-CM

## 2014-06-07 DIAGNOSIS — J329 Chronic sinusitis, unspecified: Secondary | ICD-10-CM | POA: Diagnosis not present

## 2014-06-07 DIAGNOSIS — J209 Acute bronchitis, unspecified: Secondary | ICD-10-CM | POA: Diagnosis not present

## 2014-06-07 DIAGNOSIS — R059 Cough, unspecified: Secondary | ICD-10-CM

## 2014-06-07 MED ORDER — CEFTRIAXONE SODIUM 1 G IJ SOLR
500.0000 mg | Freq: Once | INTRAMUSCULAR | Status: AC
  Administered 2014-06-07: 500 mg via INTRAMUSCULAR

## 2014-06-07 MED ORDER — LEVOFLOXACIN 500 MG PO TABS: 500.0000 mg | ORAL_TABLET | Freq: Every day | ORAL | Status: AC

## 2014-06-07 NOTE — Progress Notes (Signed)
   Subjective:    Patient ID: Tammy Mosley, female    DOB: 1929/02/22, 81 y.o.   MRN: [id number]  Cough This is a new problem. The current episode started in the past 7 days. Associated symptoms include rhinorrhea and wheezing. Associated symptoms comments: Watery eyes, sneezing. The symptoms are aggravated by pollens. She has tried OTC cough suppressant for the symptoms. Her past medical history is significant for asthma.   Using otc cough meds dim energy  achey   No highdr fevr   Cough productive at times of phlegm Review of Systems  HENT: Positive for rhinorrhea.   Respiratory: Positive for cough and wheezing.    No vomiting no diarrhea appetite somewhat diminished    Objective:   Physical Exam  Alert mild malaise. Vitals stable. HEENT moderate nasal congestion with somewhat coarse intermittent bronchial cough. No crackles no wheezes no tachypnea heart regular in rhythm.      Assessment & Plan:  Impression acute rhinosinusitis bronchitis plan antibiotics prescribed. Patient requests injection. Levaquin prescribed after Rocephin today. Warning signs discussed WSL

## 2014-07-11 ENCOUNTER — Other Ambulatory Visit: Payer: Self-pay | Admitting: Family Medicine

## 2014-07-11 ENCOUNTER — Telehealth: Payer: Self-pay | Admitting: Family Medicine

## 2014-07-11 NOTE — Telephone Encounter (Signed)
Pt stated she was asking for her thyroid med, maybe she called in the wrong  Script to the pharmacy, may need to clarify with her again what she is wanting. She did say her thyroid med several times and that she thought 200 - 500 was a  Big jump in script. Maybe she has them confused?

## 2014-07-11 NOTE — Telephone Encounter (Signed)
levothyroxine (SYNTHROID, LEVOTHROID) 200 MCG tablet  Pt is out of this med now, would like a refill   She states they filled it at 500mg  last time that is what she has been taking She states she thought it was a big jump in dosage, wants to know if she  Was to be taking 200 or 500mg ? Our records indicate [place]   Please advise

## 2014-07-11 NOTE — Telephone Encounter (Signed)
Pt does not need a refill on levothyroxine. She states she has some left. She had a empty bottle of levaquin and thought it was here levothyroxine. Discussed with pt that her levothyroxine is 262mcg and she should be taking one daily. Pt verbalized understanding.

## 2014-07-11 NOTE — Telephone Encounter (Signed)
Recent refill request came in this am for Levaquin 500 mg this am and it was denied with note for patient to contact office since antibiotic.

## 2014-08-07 DIAGNOSIS — L518 Other erythema multiforme: Secondary | ICD-10-CM | POA: Diagnosis not present

## 2014-09-18 ENCOUNTER — Encounter (HOSPITAL_COMMUNITY): Payer: Self-pay | Admitting: *Deleted

## 2014-09-18 ENCOUNTER — Emergency Department (HOSPITAL_COMMUNITY): Payer: Medicare Other

## 2014-09-18 ENCOUNTER — Emergency Department (HOSPITAL_COMMUNITY)
Admission: EM | Admit: 2014-09-18 | Discharge: 2014-09-18 | Disposition: A | Discharge status: Home / Self Care | Payer: Medicare Other | Attending: Emergency Medicine | Admitting: Emergency Medicine

## 2014-09-18 DIAGNOSIS — Z79899 Other long term (current) drug therapy: Secondary | ICD-10-CM | POA: Diagnosis not present

## 2014-09-18 DIAGNOSIS — Z7982 Long term (current) use of aspirin: Secondary | ICD-10-CM | POA: Insufficient documentation

## 2014-09-18 DIAGNOSIS — J45901 Unspecified asthma with (acute) exacerbation: Secondary | ICD-10-CM | POA: Insufficient documentation

## 2014-09-18 DIAGNOSIS — Z8543 Personal history of malignant neoplasm of ovary: Secondary | ICD-10-CM | POA: Insufficient documentation

## 2014-09-18 DIAGNOSIS — Z8679 Personal history of other diseases of the circulatory system: Secondary | ICD-10-CM | POA: Diagnosis not present

## 2014-09-18 DIAGNOSIS — Z8719 Personal history of other diseases of the digestive system: Secondary | ICD-10-CM | POA: Insufficient documentation

## 2014-09-18 DIAGNOSIS — H409 Unspecified glaucoma: Secondary | ICD-10-CM | POA: Insufficient documentation

## 2014-09-18 DIAGNOSIS — E039 Hypothyroidism, unspecified: Secondary | ICD-10-CM | POA: Insufficient documentation

## 2014-09-18 DIAGNOSIS — Z87891 Personal history of nicotine dependence: Secondary | ICD-10-CM | POA: Insufficient documentation

## 2014-09-18 DIAGNOSIS — I517 Cardiomegaly: Secondary | ICD-10-CM | POA: Diagnosis not present

## 2014-09-18 DIAGNOSIS — R002 Palpitations: Secondary | ICD-10-CM | POA: Diagnosis not present

## 2014-09-18 LAB — CBC WITH DIFFERENTIAL/PLATELET
Basophils Absolute: 0 10*3/uL (ref 0.0–0.1)
Basophils Relative: 1 % (ref 0–1)
Eosinophils Absolute: 0.2 10*3/uL (ref 0.0–0.7)
Eosinophils Relative: 5 % (ref 0–5)
HCT: 42.5 % (ref 36.0–46.0)
HEMOGLOBIN: 14.2 g/dL (ref 12.0–15.0)
LYMPHS PCT: 16 % (ref 12–46)
Lymphs Abs: 0.7 10*3/uL (ref 0.7–4.0)
MCH: 29.2 pg (ref 26.0–34.0)
MCHC: 33.4 g/dL (ref 30.0–36.0)
MCV: 87.3 fL (ref 78.0–100.0)
MONOS PCT: 8 % (ref 3–12)
Monocytes Absolute: 0.4 10*3/uL (ref 0.1–1.0)
NEUTROS PCT: 70 % (ref 43–77)
Neutro Abs: 3.3 10*3/uL (ref 1.7–7.7)
Platelets: 206 10*3/uL (ref 150–400)
RBC: 4.87 MIL/uL (ref 3.87–5.11)
RDW: 12.9 % (ref 11.5–15.5)
WBC: 4.6 10*3/uL (ref 4.0–10.5)

## 2014-09-18 LAB — BASIC METABOLIC PANEL
ANION GAP: 10 (ref 5–15)
BUN: 15 mg/dL (ref 6–20)
CHLORIDE: 103 mmol/L (ref 101–111)
CO2: 27 mmol/L (ref 22–32)
Calcium: 9.1 mg/dL (ref 8.9–10.3)
Creatinine, Ser: 0.86 mg/dL (ref 0.44–1.00)
GFR calc non Af Amer: >60 mL/min (ref >60)
Glucose, Bld: 127 mg/dL — ABNORMAL HIGH (ref 65–99)
Potassium: 3.3 mmol/L — ABNORMAL LOW (ref 3.5–5.1)
Sodium: 140 mmol/L (ref 135–145)

## 2014-09-18 LAB — TSH: TSH: 0.031 u[IU]/mL — ABNORMAL LOW (ref 0.350–4.500)

## 2014-09-18 MED ORDER — POTASSIUM CHLORIDE CRYS ER 20 MEQ PO TBCR
20.0000 meq | EXTENDED_RELEASE_TABLET | Freq: Once | ORAL | Status: AC
  Administered 2014-09-18: 20 meq via ORAL
  Filled 2014-09-18: qty 1

## 2014-09-18 NOTE — ED Provider Notes (Signed)
History  This chart was scribed for Tammy Greek, MD by Marlowe Kays, ED Scribe. This patient was seen in room APA02/APA02 and the patient's care was started at 9:17 AM.  Chief Complaint  Patient presents with  . Palpitations   The history is provided by the patient and medical records. No language interpreter was used.    HPI Comments:  Tammy Mosley is a 81 y.o. female who presents to the Emergency Department complaining of a racing heart beat that has been ongoing intermittently for the past two weeks. She reports associated diaphoresis and SOB with this morning's episode. She complains having a "stuffy head" as well but unlikely related. She has not done anything for treatment. She denies modifying factors. Denies CP, fever, chills, abdominal pain, nausea or vomiting.   Past Medical History  Diagnosis Date  . Ovarian cancer 1999  . Constipation   . Hypothyroidism   . Glaucoma   . Cataracts, bilateral   . S/P colonoscopy 2004    Dr. Lizbeth Bark, [facility], diverticulosis and hemorrhoids  . Asthma   . Frequent PVCs   . Insomnia    Past Surgical History  Procedure Laterality Date  . Thyroid surgery    . Total vaginal hysterectomy      due to ovarian cancer  . Hernia repair      X 2  . Breast surgery      calcification removal, L breast  . Toe surgery      toes on both feet straightened  . Eye surgery     Family History  Problem Relation Age of Onset  . Colon cancer Neg Hx   . Lung cancer Father 66    deceased  . Emphysema Mother 57    deceased  . Breast cancer Sister     living    History  Substance Use Topics  . Smoking status: Former Research scientist (life sciences)  . Smokeless tobacco: Never Used  . Alcohol Use: No   OB History    No data available     Review of Systems  Constitutional: Negative for fever and chills.  Respiratory: Positive for shortness of breath.   Cardiovascular: Positive for palpitations. Negative for chest pain.  Gastrointestinal: Negative for  nausea, vomiting and abdominal pain.  All other systems reviewed and are negative.   Allergies  Review of patient's allergies indicates no known allergies.  Home Medications   Prior to Admission medications   Medication Sig Start Date End Date Taking? Authorizing Provider  aspirin EC 81 MG tablet Take 81 mg by mouth daily.   Yes Historical Provider, MD  brimonidine-timolol (COMBIGAN) 0.2-0.5 % ophthalmic solution Place 1 drop into both eyes every 12 (twelve) hours.   Yes Historical Provider, MD  latanoprost (XALATAN) 0.005 % ophthalmic solution Place 1 drop into the left eye at bedtime.  05/25/12  Yes Historical Provider, MD  levothyroxine (SYNTHROID, LEVOTHROID) 200 MCG tablet TAKE ONE TABLET BY MOUTH ONCE DAILY BEFORE BREAKFAST. 05/03/14  Yes Mikey Kirschner, MD  donepezil (ARICEPT) 10 MG tablet Take 1 tablet (10 mg total) by mouth at bedtime. Patient not taking: Reported on 09/18/2014 01/24/14   Mikey Kirschner, MD  ibuprofen (ADVIL,MOTRIN) 200 MG tablet Take 200 mg by mouth every 6 (six) hours as needed for pain.    Historical Provider, MD   Triage Vitals: Pulse 74  Temp(Src) 98 F (36.7 C) (Oral)  Resp 18  Ht 5\' 7"  (1.702 m)  Wt 140 lb (63.504 kg)  BMI 21.92  kg/m2  SpO2 100% Physical Exam  Constitutional: She is oriented to person, place, and time. She appears well-developed and well-nourished. No distress.  HENT:  Head: Normocephalic and atraumatic.  Right Ear: Hearing normal.  Left Ear: Hearing normal.  Nose: Nose normal.  Mouth/Throat: Oropharynx is clear and moist and mucous membranes are normal.  Eyes: Conjunctivae and EOM are normal. Pupils are equal, round, and reactive to light.  Neck: Normal range of motion. Neck supple.  Cardiovascular: Normal rate, regular rhythm, S1 normal, S2 normal and normal heart sounds.  Exam reveals no gallop and no friction rub.   No murmur heard. Pulmonary/Chest: Effort normal and breath sounds normal. No respiratory distress. She  exhibits no tenderness.  Abdominal: Soft. Normal appearance and bowel sounds are normal. There is no hepatosplenomegaly. There is no tenderness. There is no rebound, no guarding, no tenderness at McBurney's point and negative Murphy's sign. No hernia.  Musculoskeletal: Normal range of motion.  Neurological: She is alert and oriented to person, place, and time. She has normal strength. No cranial nerve deficit or sensory deficit. Coordination normal. GCS eye subscore is 4. GCS verbal subscore is 5. GCS motor subscore is 6.  Skin: Skin is warm, dry and intact. No rash noted. No cyanosis.  Psychiatric: She has a normal mood and affect. Her speech is normal and behavior is normal. Thought content normal.    ED Course  Procedures (including critical care time) DIAGNOSTIC STUDIES: Oxygen Saturation is 100% on RA, normal by my interpretation.   COORDINATION OF CARE: 9:21 AM- Will put on monitor and observe patient. Pt verbalizes understanding and agrees to plan.  Medications - No data to display  Labs Review Labs Reviewed  BASIC METABOLIC PANEL - Abnormal; Notable for the following:    Potassium 3.3 (*)    Glucose, Bld 127 (*)    All other components within normal limits  CBC WITH DIFFERENTIAL/PLATELET  TSH    Imaging Review Dg Chest 2 View  09/18/2014   CLINICAL DATA:  Intermittent heart palpitations  EXAM: CHEST  2 VIEW  COMPARISON:  07/14/2012  FINDINGS: Stable cardiac silhouette, upper normal to mildly enlarged and exaggerated on the frontal view due to element of pectus deformity. Mild calcification of the aortic arch stable. Vascular pattern normal. Lungs clear.  IMPRESSION: No acute abnormalities.  Mild aortic atherosclerosis.   Electronically Signed   By: Skipper Cliche M.D.   On: 09/18/2014 09:49     EKG Interpretation   Date/Time:  Tuesday September 18 2014 09:17:49 EDT Ventricular Rate:  74 PR Interval:  160 QRS Duration: 74 QT Interval:  381 QTC Calculation: 423 R Axis:    42 Text Interpretation:  Sinus rhythm Normal ECG Confirmed by Evaleen Sant  MD,  Rhemi Balbach ([id number]) on 09/18/2014 10:04:20 AM      MDM   Final diagnoses:  Heart palpitations    Patient presents to the ER for evaluation of her palpitations. Patient does have a history of dementia and is having trouble giving a lucid history. She had palpitations earlier today, but they resolved by the time she was seen in the ER. She has been monitored for an extended period of time and there has not been any arrhythmia. She has found to be hyperthyroid, likely from her Synthroid replacement. She now admits that she may have doubled up on her dose at least once in the last couple of days. Rest of her cardiac workup has been unremarkable.  Patient was normotensive at arrival, she became  hypertensive here in the ER. This coincided with agitation about wanting to be discharged. I did discuss her briefly with her primary care physician, Dr. Wolfgang Phoenix. He will see her in the office at the end of this week. Until then she will hold her Synthroid.  I personally performed the services described in this documentation, which was scribed in my presence. The recorded information has been reviewed and is accurate.    Tammy Greek, MD 09/18/14 [phone]

## 2014-09-18 NOTE — ED Notes (Signed)
Pt co "heart racing" that comes and goes, denies feeling at moment, resolved during triage. Pt states had similar episode yesterday.

## 2014-09-18 NOTE — Discharge Instructions (Signed)
Do not take your Synthroid until you see Dr. Wolfgang Phoenix on Friday for a recheck. He will tell you when to restart it.  Palpitations A palpitation is the feeling that your heartbeat is irregular. It may feel like your heart is fluttering or skipping a beat. It may also feel like your heart is beating faster than normal. This is usually not a serious problem. In some cases, you may need more medical tests. HOME CARE  Avoid:  Caffeine in coffee, tea, soft drinks, diet pills, and energy drinks.  Chocolate.  Alcohol.  Stop smoking if you smoke.  Reduce your stress and anxiety. Try:  A method that measures bodily functions so you can learn to control them (biofeedback).  Yoga.  Meditation.  Physical activity such as swimming, jogging, or walking.  Get plenty of rest and sleep. GET HELP IF:  Your fast or irregular heartbeat continues after 24 hours.  Your palpitations occur more often. GET HELP RIGHT AWAY IF:   You have chest pain.  You feel short of breath.  You have a very bad headache.  You feel dizzy or pass out (faint). MAKE SURE YOU:   Understand these instructions.  Will watch your condition.  Will get help right away if you are not doing well or get worse. Document Released: 12/10/2007 Document Revised: 07/17/2013 Document Reviewed: 05/01/2011 [facility] Patient Information 2015 [place], [place]. This information is not intended to replace advice given to you by your health care provider. Make sure you discuss any questions you have with your health care provider.

## 2014-09-24 ENCOUNTER — Ambulatory Visit (INDEPENDENT_AMBULATORY_CARE_PROVIDER_SITE_OTHER): Payer: Medicare Other | Admitting: Family Medicine

## 2014-09-24 ENCOUNTER — Encounter: Payer: Self-pay | Admitting: Family Medicine

## 2014-09-24 VITALS — BP 100/60 | Ht 67.0 in | Wt 123.0 lb

## 2014-09-24 DIAGNOSIS — E039 Hypothyroidism, unspecified: Secondary | ICD-10-CM | POA: Diagnosis not present

## 2014-09-24 DIAGNOSIS — E782 Mixed hyperlipidemia: Secondary | ICD-10-CM

## 2014-09-24 DIAGNOSIS — E038 Other specified hypothyroidism: Secondary | ICD-10-CM

## 2014-09-24 DIAGNOSIS — G309 Alzheimer's disease, unspecified: Secondary | ICD-10-CM

## 2014-09-24 DIAGNOSIS — F0280 Dementia in other diseases classified elsewhere without behavioral disturbance: Secondary | ICD-10-CM

## 2014-09-24 MED ORDER — LEVOTHYROXINE SODIUM 175 MCG PO TABS: 175.0000 ug | ORAL_TABLET | Freq: Every day | ORAL | Status: DC

## 2014-09-24 NOTE — Progress Notes (Signed)
   Subjective:    Patient ID: Tammy Mosley, female    DOB: 03-Mar-1929, 81 y.o.   MRN: [id number]  Palpitations  This is a new problem. The current episode started in the past 7 days. The problem has been gradually improving. Associated symptoms include an irregular heartbeat and malaise/fatigue.   Patient in today for post ED visit on 09/18/14 at [facility] for heart palpitations. Patient states that she is still having some issues with irregular heart beat. Though significantly improved since backing off on thyroid. No true chest pain.  I spoke with ER doctor. He felt pretty strongly patient did not have any cardiac issues.  Patient would like to discuss weight loss also. Patient notes her appetite is not as good as usual and she seems to be losing more weight than usual.   Review of Systems  Constitutional: Positive for malaise/fatigue.  Cardiovascular: Positive for palpitations.   no headache no chest pain no loose stools no excessive heat or cold intolerance     Objective:   Physical Exam  Alert vital stable HEENT normal. Thyroid nonpalpable lungs clear. Heart regular rate and rhythm. Ankles no edema.      Assessment & Plan:  Impression 1 hypothyroidism dose to strong I suspect some noncompliance 2. Family admits that she is taking a few extra doses here and there #2 palpitations clinically improved #3 weight loss discussed plan follow-up as scheduled. Blood work one week before then.

## 2014-09-24 NOTE — Patient Instructions (Signed)
Please do blood test one week before next visit

## 2014-12-25 ENCOUNTER — Ambulatory Visit (INDEPENDENT_AMBULATORY_CARE_PROVIDER_SITE_OTHER): Payer: Medicare Other | Admitting: Family Medicine

## 2014-12-25 ENCOUNTER — Encounter: Payer: Self-pay | Admitting: Family Medicine

## 2014-12-25 VITALS — BP 128/78 | Ht 67.0 in | Wt 114.1 lb

## 2014-12-25 DIAGNOSIS — E039 Hypothyroidism, unspecified: Secondary | ICD-10-CM

## 2014-12-25 DIAGNOSIS — F0280 Dementia in other diseases classified elsewhere without behavioral disturbance: Secondary | ICD-10-CM | POA: Diagnosis not present

## 2014-12-25 DIAGNOSIS — Z23 Encounter for immunization: Secondary | ICD-10-CM | POA: Diagnosis not present

## 2014-12-25 DIAGNOSIS — G301 Alzheimer's disease with late onset: Secondary | ICD-10-CM

## 2014-12-25 DIAGNOSIS — Z139 Encounter for screening, unspecified: Secondary | ICD-10-CM | POA: Diagnosis not present

## 2014-12-25 DIAGNOSIS — M479 Spondylosis, unspecified: Secondary | ICD-10-CM

## 2014-12-25 MED ORDER — LEVOTHYROXINE SODIUM 175 MCG PO TABS: 175.0000 ug | ORAL_TABLET | Freq: Every day | ORAL | Status: DC

## 2014-12-25 MED ORDER — LATANOPROST 0.005 % OP SOLN: 1.0000 [drp] | Freq: Every day | OPHTHALMIC | Status: DC

## 2014-12-25 MED ORDER — DONEPEZIL HCL 10 MG PO TABS
10.0000 mg | ORAL_TABLET | Freq: Every day | ORAL | Status: DC
Start: 2014-12-25 — End: 2016-10-23

## 2014-12-25 NOTE — Progress Notes (Signed)
   Subjective:    Patient ID: Tammy Mosley, female    DOB: 10-11-1928, 81 y.o.   MRN: [id number] Patient arrives office for follow-up of multiple concerns. Hyperlipidemia This is a chronic problem. The current episode started more than 1 year ago. There are no compliance problems.    compliant with diet.   Patient in today for 3 month follow up. Patient states she is forgetful and can not remember which pills she takes in the morning and the night. Husband helps her remember her medication. He claims compliance with Aricept 10 mg.   Ongoing joint pain. Primarily back and hands. Ibuprofen does help. No obvious bad side effects from this.  Compliant with thyroid medication. Does not miss a dose according to husband. We were due to have blood work available today but patient forgot to do it. We'll do it later on today     Review of Systems No headache no chest pain no abdominal pain no change in bowel habits ROS otherwise negative    Objective:   Physical Exam  Alert vital stable oriented 1 neck supple lungs clear heart rare rhythm hands Heberden's nodes ankles without edema      Assessment & Plan:  Impression 1 dementia ongoing challenges. Family appears to be doing their best. Son visits them regularly. #2 hypothyroidism status uncertain #3 arthritis discuss plan Advil when necessary. Walking encourage. Appropriate blood work. Further recommendations based results. Thyroid medicine and Aricept refilled. Flu shot today WSL

## 2014-12-26 LAB — BASIC METABOLIC PANEL
BUN/Creatinine Ratio: 21 (ref 11–26)
BUN: 15 mg/dL (ref 8–27)
CO2: 21 mmol/L (ref 18–29)
CREATININE: 0.73 mg/dL (ref 0.57–1.00)
Calcium: 9.6 mg/dL (ref 8.7–10.3)
Chloride: 99 mmol/L (ref 97–108)
GFR, EST AFRICAN AMERICAN: 87 mL/min/{1.73_m2} (ref >59)
GFR, EST NON AFRICAN AMERICAN: 76 mL/min/{1.73_m2} (ref >59)
Glucose: 85 mg/dL (ref 65–99)
Potassium: 4.4 mmol/L (ref 3.5–5.2)
SODIUM: 144 mmol/L (ref 134–144)

## 2014-12-26 LAB — CBC WITH DIFFERENTIAL/PLATELET
BASOS: 0 %
Basophils Absolute: 0 10*3/uL (ref 0.0–0.2)
EOS (ABSOLUTE): 0.4 10*3/uL (ref 0.0–0.4)
EOS: 5 %
HEMATOCRIT: 45.2 % (ref 34.0–46.6)
Hemoglobin: 15.1 g/dL (ref 11.1–15.9)
IMMATURE GRANULOCYTES: 0 %
Immature Grans (Abs): 0 10*3/uL (ref 0.0–0.1)
Lymphocytes Absolute: 1.6 10*3/uL (ref 0.7–3.1)
Lymphs: 23 %
MCH: 28.4 pg (ref 26.6–33.0)
MCHC: 33.4 g/dL (ref 31.5–35.7)
MCV: 85 fL (ref 79–97)
MONOS ABS: 0.5 10*3/uL (ref 0.1–0.9)
Monocytes: 7 %
NEUTROS ABS: 4.6 10*3/uL (ref 1.4–7.0)
NEUTROS PCT: 65 %
Platelets: 260 10*3/uL (ref 150–379)
RBC: 5.32 x10E6/uL — ABNORMAL HIGH (ref 3.77–5.28)
RDW: 14.7 % (ref 12.3–15.4)
WBC: 7.1 10*3/uL (ref 3.4–10.8)

## 2014-12-26 LAB — TSH: TSH: 0.012 u[IU]/mL — ABNORMAL LOW (ref 0.450–4.500)

## 2014-12-31 MED ORDER — LEVOTHYROXINE SODIUM 150 MCG PO TABS: 150.0000 ug | ORAL_TABLET | Freq: Every day | ORAL | Status: DC

## 2014-12-31 NOTE — Addendum Note (Signed)
Addended by: Ofilia Neas R on: 12/31/2014 11:26 AM   Modules accepted: Orders

## 2014-12-31 NOTE — Addendum Note (Signed)
Addended by: Ofilia Neas R on: 12/31/2014 11:21 AM   Modules accepted: Orders, Medications

## 2015-01-11 ENCOUNTER — Ambulatory Visit (INDEPENDENT_AMBULATORY_CARE_PROVIDER_SITE_OTHER): Payer: Medicare Other | Admitting: Nurse Practitioner

## 2015-01-11 VITALS — BP 132/80 | Temp 97.8°F | Ht 67.0 in | Wt 116.4 lb

## 2015-01-11 DIAGNOSIS — N390 Urinary tract infection, site not specified: Secondary | ICD-10-CM | POA: Diagnosis not present

## 2015-01-11 DIAGNOSIS — R350 Frequency of micturition: Secondary | ICD-10-CM | POA: Diagnosis not present

## 2015-01-11 LAB — POCT URINALYSIS DIPSTICK
RBC UA: POSITIVE
Spec Grav, UA: 1.01
pH, UA: 6

## 2015-01-11 MED ORDER — CEFDINIR 300 MG PO CAPS
300.0000 mg | ORAL_CAPSULE | Freq: Two times a day (BID) | ORAL | Status: DC
Start: 2015-01-11 — End: 2015-04-02

## 2015-01-13 LAB — URINE CULTURE

## 2015-01-14 ENCOUNTER — Encounter: Payer: Self-pay | Admitting: Nurse Practitioner

## 2015-01-14 LAB — POCT UA - MICROSCOPIC ONLY
BACTERIA, U MICROSCOPIC: POSITIVE
RBC, urine, microscopic: NEGATIVE

## 2015-01-14 NOTE — Progress Notes (Signed)
Subjective:  Presents for complaints of urinary frequency over the past week. No fever.No dysuria.Some urgency. No back pain. Slight pelvic discomfort. No nausea vomiting or abdominal pain. Taking fluids well. No history of recent UTI. No vaginal discharge.  Objective:   BP 132/80 mmHg  Temp(Src) 97.8 F (36.6 C) (Oral)  Ht 5\' 7"  (1.702 m)  Wt 116 lb 6 oz (52.787 kg)  BMI 18.22 kg/m2  NAD. Alert, oriented.  Lungs clear. Heart regular rate rhythm. abdomen soft nondistended nontender. No CVA tenderness. Results for orders placed or performed in visit on 01/11/15                  POCT urinalysis dipstick  Result Value Ref Range   Color, UA Yellow    Clarity, UA Clear    Glucose, UA     Bilirubin, UA     Ketones, UA     Spec Grav, UA 1.010    Blood, UA Positive    pH, UA 6.0    Protein, UA     Urobilinogen, UA     Nitrite, UA     Leukocytes, UA large (3+) (A) Negative  POCT UA - Microscopic Only  Result Value Ref Range   WBC, Ur, HPF, POC TNTC    RBC, urine, microscopic neg    Bacteria, U Microscopic pos    Mucus, UA     Epithelial cells, urine per micros rare    Crystals, Ur, HPF, POC     Casts, Ur, LPF, POC     Yeast, UA         Assessment: Urinary tract infection without hematuria, site unspecified - Plan: POCT urinalysis dipstick, Urine Culture  Plan:  Meds ordered this encounter  Medications  . cefdinir (OMNICEF) 300 MG capsule    Sig: Take 1 capsule (300 mg total) by mouth 2 (two) times daily.    Dispense:  14 capsule    Refill:  0    Order Specific Question:  Supervising Provider    Answer:  Mikey Kirschner [[id number]]    Warning signs reviewed. Call back in 72 hour if no improvement in symptoms,call or go to ED over the weekend if worse.

## 2015-02-25 DIAGNOSIS — Z961 Presence of intraocular lens: Secondary | ICD-10-CM | POA: Diagnosis not present

## 2015-02-25 DIAGNOSIS — H04123 Dry eye syndrome of bilateral lacrimal glands: Secondary | ICD-10-CM | POA: Diagnosis not present

## 2015-02-25 DIAGNOSIS — H409 Unspecified glaucoma: Secondary | ICD-10-CM | POA: Diagnosis not present

## 2015-03-27 ENCOUNTER — Ambulatory Visit: Payer: Medicare Other | Admitting: Family Medicine

## 2015-04-02 ENCOUNTER — Ambulatory Visit (INDEPENDENT_AMBULATORY_CARE_PROVIDER_SITE_OTHER): Payer: Medicare Other | Admitting: Family Medicine

## 2015-04-02 ENCOUNTER — Encounter: Payer: Self-pay | Admitting: Family Medicine

## 2015-04-02 VITALS — BP 102/68 | Ht 67.0 in | Wt 112.0 lb

## 2015-04-02 DIAGNOSIS — E039 Hypothyroidism, unspecified: Secondary | ICD-10-CM

## 2015-04-02 DIAGNOSIS — L03319 Cellulitis of trunk, unspecified: Secondary | ICD-10-CM

## 2015-04-02 DIAGNOSIS — F0280 Dementia in other diseases classified elsewhere without behavioral disturbance: Secondary | ICD-10-CM

## 2015-04-02 DIAGNOSIS — L02219 Cutaneous abscess of trunk, unspecified: Secondary | ICD-10-CM

## 2015-04-02 DIAGNOSIS — G301 Alzheimer's disease with late onset: Secondary | ICD-10-CM | POA: Diagnosis not present

## 2015-04-02 MED ORDER — CEPHALEXIN 500 MG PO CAPS: 500.0000 mg | ORAL_CAPSULE | Freq: Three times a day (TID) | ORAL | Status: DC

## 2015-04-02 NOTE — Patient Instructions (Signed)
Please take off the dressing and remove the ribbon strip drain tomorrow afternoon, then clean and bandage twice a day for several days

## 2015-04-02 NOTE — Progress Notes (Signed)
   Subjective:    Patient ID: Tammy Mosley, female    DOB: [birth year], 82 y.o.   MRN: [id number]  HPIHypothyroid. Takes levothyroxine 150 mcg daily. Family working with thyroid dosing the make sure she doesn't miss a dose. Meds reviewed today. Next  Ongoing challenges with dementia and forgetfulness. Compliant with medications in this regard. Husband reports more more forgetful. Patient does not drive. No loss of consciousness.  Patient is developed a sore tender region at the edge of her right breast. No fever or chills. No obvious discharge  Lump on right breast.     Review of Systems No headache no chest pain no back pain no abdominal pain no change in bowel habits ROS otherwise negative    Objective:   Physical Exam Alert vitals stable oriented 1. HEENT thyroid nonpalpable neck supple lungs clear heart rare rhythm medial breast abscess palpated  Procedure note patient was prepped draped anesthetized in size abscess drained drain applied wound care discussed       Assessment & Plan:  Impression 1 dementia ongoing progress discussed at length will maintain same #2 hypothyroidism medications discussed maintain same dosage #3 abscess proper wound care discussed plan follow-up as scheduled diet exercise discussed compliance discussed WSL

## 2015-05-27 ENCOUNTER — Encounter: Payer: Self-pay | Admitting: *Deleted

## 2015-07-02 ENCOUNTER — Ambulatory Visit: Payer: Medicare Other | Admitting: Family Medicine

## 2015-07-04 ENCOUNTER — Ambulatory Visit: Payer: Medicare Other | Admitting: Family Medicine

## 2015-07-05 ENCOUNTER — Encounter: Payer: Self-pay | Admitting: Nurse Practitioner

## 2015-07-05 ENCOUNTER — Ambulatory Visit (INDEPENDENT_AMBULATORY_CARE_PROVIDER_SITE_OTHER): Payer: Medicare Other | Admitting: Nurse Practitioner

## 2015-07-05 VITALS — BP 138/80 | Temp 98.3°F | Ht 67.0 in | Wt 114.0 lb

## 2015-07-05 DIAGNOSIS — R21 Rash and other nonspecific skin eruption: Secondary | ICD-10-CM

## 2015-07-05 MED ORDER — TRIAMCINOLONE ACETONIDE 0.1 % EX CREA: 1.0000 "application " | TOPICAL_CREAM | Freq: Two times a day (BID) | CUTANEOUS | Status: DC

## 2015-07-05 MED ORDER — KETOCONAZOLE 2 % EX CREA: 1.0000 "application " | TOPICAL_CREAM | Freq: Two times a day (BID) | CUTANEOUS | Status: AC

## 2015-07-05 NOTE — Patient Instructions (Signed)
Loratadine 10 mg in the morning Benadryl 25 mg at night Zantac or Pepcid once a day Triamcinolone cream to rash on body including underarms Ketoconazole cream to underarms only (use with Triamcinolone)

## 2015-07-06 ENCOUNTER — Encounter: Payer: Self-pay | Admitting: Nurse Practitioner

## 2015-07-06 NOTE — Progress Notes (Signed)
Subjective:  Presents with her husband for c/o rash that began about 4 days ago. Has slowly spread. Mainly on upper body. Very pruritic. Worse in axillary area. Has noticed peeling of dry skin in this area. Has stopped wearing deodorant. No fever. No known allergens. No change in hygiene products. No known contacts. No change in OTC or Rx meds. Took one dose of Benadryl yesterday which seemed to help itching.   Objective:   BP 138/80 mmHg  Temp(Src) 98.3 F (36.8 C) (Oral)  Ht 5\' 7"  (1.702 m)  Wt 114 lb (51.71 kg)  BMI 17.85 kg/m2 NAD. Alert, oriented. Lungs clear. Heart RRR. Multiple discrete pink maculopapular lesions on upper body. Confluent dry pink rash covering entire axillary area bilat; well defined borders; peeling with slight fissures noted. No signs of bacterial infection.   Assessment: Rash and nonspecific skin eruption/possible secondary fungal infection bilat axillary areas  Plan:  Meds ordered this encounter  Medications  . triamcinolone cream (KENALOG) 0.1 %    Sig: Apply 1 application topically 2 (two) times daily. Prn rash on body and underarms; use up to 2 weeks    Dispense:  60 g    Refill:  0    Order Specific Question:  Supervising Provider    Answer:  Mikey Kirschner [[id number]]  . ketoconazole (NIZORAL) 2 % cream    Sig: Apply 1 application topically 2 (two) times daily. To underarms only    Dispense:  30 g    Refill:  0    Order Specific Question:  Supervising Provider    Answer:  Mikey Kirschner [[id number]]   Loratadine 10 mg in the morning Benadryl 25 mg at night Zantac or Pepcid once a day Triamcinolone cream to rash on body including underarms Ketoconazole cream to underarms only (use with Triamcinolone) Hold on steroids due to age and patient has never taken them before that she can remember. Call back in 72 hours if no improvement, sooner if worse.

## 2015-07-08 ENCOUNTER — Telehealth: Payer: Self-pay | Admitting: Family Medicine

## 2015-07-08 ENCOUNTER — Other Ambulatory Visit: Payer: Self-pay | Admitting: Nurse Practitioner

## 2015-07-08 MED ORDER — PREDNISONE 20 MG PO TABS: ORAL_TABLET | ORAL | Status: DC

## 2015-07-08 NOTE — Telephone Encounter (Signed)
Patient was seen on 07/05/2015 due to itching.  She calls this morning saying she has used the medication prescribed, but she is still itching and in bad shape. Please advise.

## 2015-07-08 NOTE — Telephone Encounter (Signed)
Since carolyn saw and more aware of whats going on plz forward thsi to her

## 2015-07-08 NOTE — Telephone Encounter (Signed)
Notified patient at this point we will need to try oral steroids due to severity of her symptoms. Continue creams to underarms. Call back by end of the week if no better, sooner if any problems. Continue OTC meds as directed for itching. Patient verbalized understanding.

## 2015-07-08 NOTE — Telephone Encounter (Signed)
At this point we will need to try oral steroids due to severity of her symptoms. Continue creams to underarms. Call back by end of the week if no better, sooner if any problems. Continue OTC meds as directed for itching.

## 2015-07-08 NOTE — Telephone Encounter (Signed)
Patient seen on 07/05/15 for itching all over. Prescribed Triamcinolone and Ketoconazole.

## 2015-07-16 ENCOUNTER — Telehealth: Payer: Self-pay | Admitting: Family Medicine

## 2015-07-16 NOTE — Telephone Encounter (Signed)
Spoke with patient's husband. Patient husband stated that rash has not resolved since office visit with Hoyle Sauer. Rash is red and itching and is under arms and back. Was prescribed Ketoconazole cream, and triamcinolone cream at last office visit. States some relief from medications but rash is still very bothersome for patient. Please advise?

## 2015-07-16 NOTE — Telephone Encounter (Signed)
Patient seen on 07/05/15 for an itchy rash by Hoyle Sauer.  Her spouse, Olga Millers, called and says she is really itching bad still and in bad shape.  Please advise.

## 2015-07-17 ENCOUNTER — Ambulatory Visit (INDEPENDENT_AMBULATORY_CARE_PROVIDER_SITE_OTHER): Payer: Medicare Other | Admitting: Family Medicine

## 2015-07-17 ENCOUNTER — Encounter: Payer: Self-pay | Admitting: Family Medicine

## 2015-07-17 VITALS — BP 110/70 | Temp 98.0°F | Ht 67.0 in | Wt 112.5 lb

## 2015-07-17 DIAGNOSIS — R21 Rash and other nonspecific skin eruption: Secondary | ICD-10-CM

## 2015-07-17 DIAGNOSIS — L853 Xerosis cutis: Secondary | ICD-10-CM

## 2015-07-17 NOTE — Progress Notes (Signed)
   Subjective:    Patient ID: Tammy Mosley, female    DOB: 07-20-1928, 82 y.o.   MRN: [id number]  Rash This is a new problem. The current episode started in the past 7 days. The problem is unchanged. The rash is diffuse. The rash is characterized by itchiness and redness. She was exposed to nothing. Treatments tried: creams. The treatment provided no relief.   Uncertain how often patient is using moisturizing creams has tried combination of Nizoral as well as steroid cream without much success. Denies any other particular troubles currently. No fever chills or sweats.   Review of Systems  Skin: Positive for rash.       Objective:   Physical Exam Has nonspecific dry skin on the arms and legs her back in her abdomen with multiple excoriated areas from where she's been itching.       Assessment & Plan:  Dry skin nonspecific rash may use steroid cream but more importantly moisturizing lotion on a regular basis. Follow through with dermatology consult. Follow-up with [place] if any other problems.

## 2015-07-18 NOTE — Telephone Encounter (Signed)
Switch to elocon cr 60 g apply bid affected area, short taper of pred modified 10 mg, four per d three d, three per d pthree d

## 2015-07-18 NOTE — Telephone Encounter (Signed)
Order canceled

## 2015-07-18 NOTE — Telephone Encounter (Signed)
No, cancel my order

## 2015-07-18 NOTE — Telephone Encounter (Signed)
Patient seen yesterday as a walk in by Dr.Scott. Did you still want to order  Medications? A referral for patient was initiated and appointment was set up for Dermatology with Dr.Hall?

## 2015-07-23 DIAGNOSIS — B86 Scabies: Secondary | ICD-10-CM | POA: Diagnosis not present

## 2015-09-12 ENCOUNTER — Other Ambulatory Visit: Payer: Self-pay | Admitting: Family Medicine

## 2015-09-25 ENCOUNTER — Encounter: Payer: Self-pay | Admitting: Family Medicine

## 2015-09-25 ENCOUNTER — Ambulatory Visit (INDEPENDENT_AMBULATORY_CARE_PROVIDER_SITE_OTHER): Payer: Medicare Other | Admitting: Family Medicine

## 2015-09-25 VITALS — BP 124/80 | Ht 67.0 in | Wt 112.0 lb

## 2015-09-25 DIAGNOSIS — Z79899 Other long term (current) drug therapy: Secondary | ICD-10-CM

## 2015-09-25 DIAGNOSIS — M479 Spondylosis, unspecified: Secondary | ICD-10-CM

## 2015-09-25 DIAGNOSIS — G301 Alzheimer's disease with late onset: Secondary | ICD-10-CM

## 2015-09-25 DIAGNOSIS — F0280 Dementia in other diseases classified elsewhere without behavioral disturbance: Secondary | ICD-10-CM

## 2015-09-25 DIAGNOSIS — R5383 Other fatigue: Secondary | ICD-10-CM | POA: Diagnosis not present

## 2015-09-25 DIAGNOSIS — E039 Hypothyroidism, unspecified: Secondary | ICD-10-CM

## 2015-09-25 NOTE — Progress Notes (Signed)
   Subjective:    Patient ID: Tammy Mosley, female    DOB: 02-20-1929, 82 y.o.   MRN: [id number]  HPI Patient is here today for follow up visit on hypothyroidism. Patient currently taking levothyroxine 150 mcg. Claims compliance.  Patient continues to lose considerable weight. States appetite is not as good. No chest pain no abdominal pain no headache.  Has not had screening physical for several years. She states she usually gets this through the [facility].  History of borderline thyroid compliance in the past 2 primarily to dementia. Family is now helping with this hopefully control is decent but bears reassessment.  History of dementia. Slowly progressing. Unfortunately Aricept seemed to cause hallucinations. Family stopped it  Patient has no other concerns at this time.    Review of Systems No headache, no major weight loss or weight gain, no chest pain no back pain abdominal pain no change in bowel habits complete ROS otherwise negative     Objective:   Physical Exam Pleasant vitals stable no acute distress HEENT normal lungs clear heart rare rhythm ankles without edema, no focal neurological deficits abdomen no obvious tenderness or masses       Assessment & Plan:  Impression 1 dementia is slowly worsening discuss family would like to retry Aricept to reinitiate. #2 hypothyroidism status uncertain #3 weight loss etiology unclear. Fairly substantial. I offered to order mammogram with history of breast cyst workup in past. Patient declined. Stated she would get back over to [facility] for her preventive/wellness exam along with GYN/breast assessment. Plan appropriate blood work. Resume Aricept. Proper nutrition discussed. Diet exercise discussed. WSL

## 2015-09-26 LAB — TSH: TSH: 0.06 u[IU]/mL — AB (ref 0.450–4.500)

## 2015-09-26 LAB — CBC WITH DIFFERENTIAL/PLATELET
BASOS ABS: 0 10*3/uL (ref 0.0–0.2)
Basos: 1 %
EOS (ABSOLUTE): 0.3 10*3/uL (ref 0.0–0.4)
Eos: 5 %
HEMOGLOBIN: 14.1 g/dL (ref 11.1–15.9)
Hematocrit: 42.8 % (ref 34.0–46.6)
IMMATURE GRANS (ABS): 0 10*3/uL (ref 0.0–0.1)
Immature Granulocytes: 0 %
LYMPHS: 16 %
Lymphocytes Absolute: 1 10*3/uL (ref 0.7–3.1)
MCH: 28.8 pg (ref 26.6–33.0)
MCHC: 32.9 g/dL (ref 31.5–35.7)
MCV: 88 fL (ref 79–97)
MONOCYTES: 8 %
Monocytes Absolute: 0.5 10*3/uL (ref 0.1–0.9)
Neutrophils Absolute: 4.2 10*3/uL (ref 1.4–7.0)
Neutrophils: 70 %
Platelets: 201 10*3/uL (ref 150–379)
RBC: 4.89 x10E6/uL (ref 3.77–5.28)
RDW: 14.5 % (ref 12.3–15.4)
WBC: 6 10*3/uL (ref 3.4–10.8)

## 2015-09-26 LAB — BASIC METABOLIC PANEL
BUN/Creatinine Ratio: 29 — ABNORMAL HIGH (ref 12–28)
BUN: 22 mg/dL (ref 8–27)
CALCIUM: 9.1 mg/dL (ref 8.7–10.3)
CO2: 24 mmol/L (ref 18–29)
Chloride: 101 mmol/L (ref 96–106)
Creatinine, Ser: 0.76 mg/dL (ref 0.57–1.00)
GFR calc non Af Amer: 72 mL/min/{1.73_m2} (ref >59)
GFR, EST AFRICAN AMERICAN: 83 mL/min/{1.73_m2} (ref >59)
GLUCOSE: 93 mg/dL (ref 65–99)
Potassium: 4.4 mmol/L (ref 3.5–5.2)
Sodium: 141 mmol/L (ref 134–144)

## 2015-09-26 LAB — HEPATIC FUNCTION PANEL
ALT: 12 IU/L (ref 0–32)
AST: 15 IU/L (ref 0–40)
Albumin: 4 g/dL (ref 3.5–4.7)
Alkaline Phosphatase: 69 IU/L (ref 39–117)
BILIRUBIN TOTAL: 0.6 mg/dL (ref 0.0–1.2)
Bilirubin, Direct: 0.13 mg/dL (ref 0.00–0.40)
Total Protein: 6.4 g/dL (ref 6.0–8.5)

## 2015-09-27 ENCOUNTER — Other Ambulatory Visit: Payer: Self-pay | Admitting: *Deleted

## 2015-09-27 DIAGNOSIS — E039 Hypothyroidism, unspecified: Secondary | ICD-10-CM

## 2015-09-27 MED ORDER — LEVOTHYROXINE SODIUM 100 MCG PO TABS: 100.0000 ug | ORAL_TABLET | Freq: Every day | ORAL | Status: DC

## 2015-10-04 IMAGING — CR DG WRIST COMPLETE 3+V*R*
4 series · 4 of 4 positions shown · non-contrast
Comparison: None.

CLINICAL DATA: Right wrist pain after fall.

EXAM:
RIGHT WRIST - COMPLETE 3+ VIEW

[view not recorded (1 of 4)]
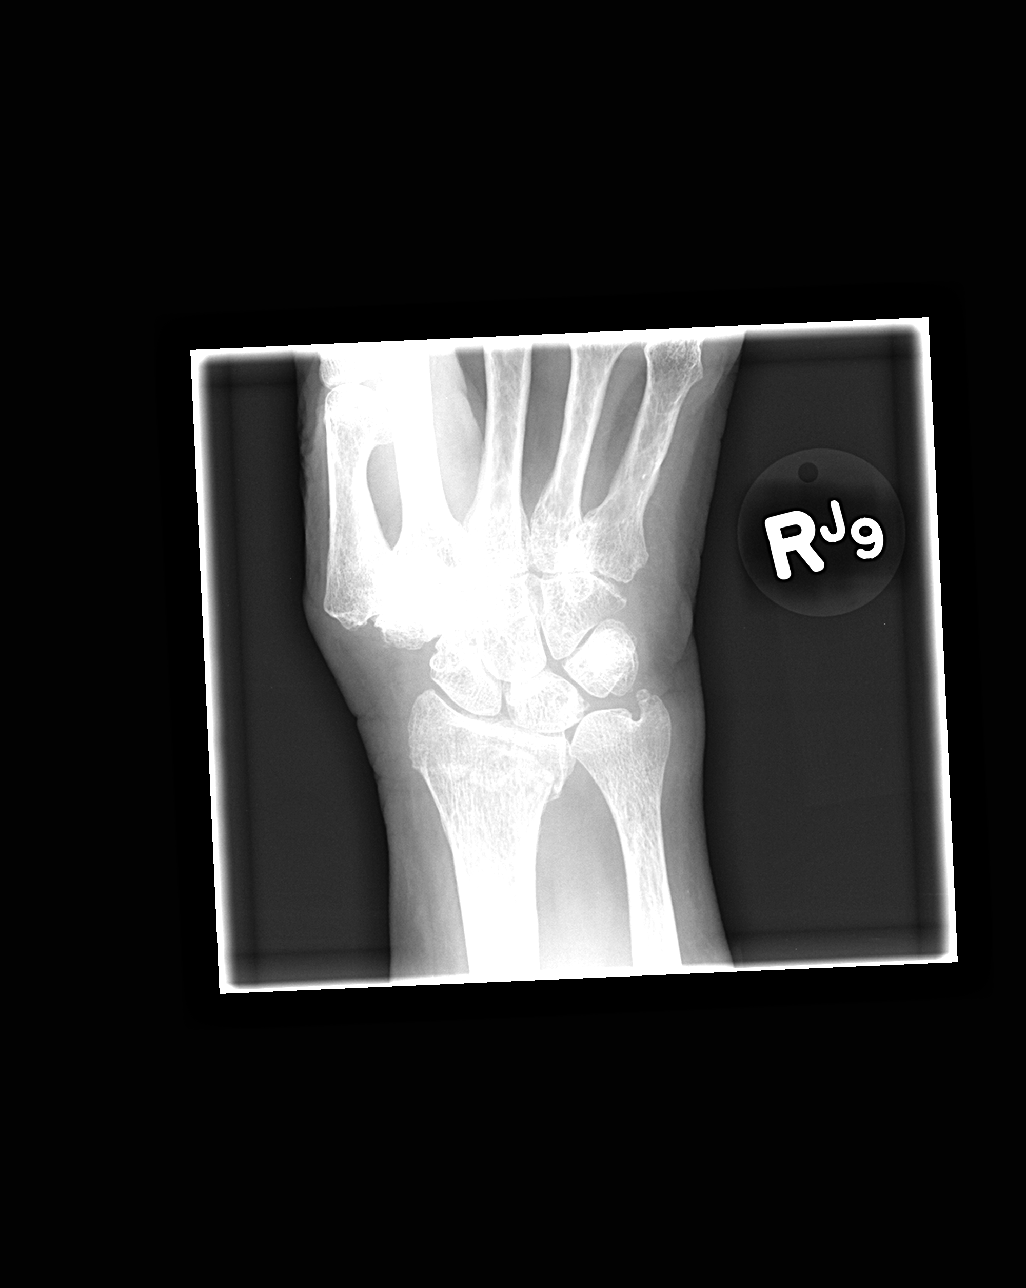

[view not recorded (2 of 4)]
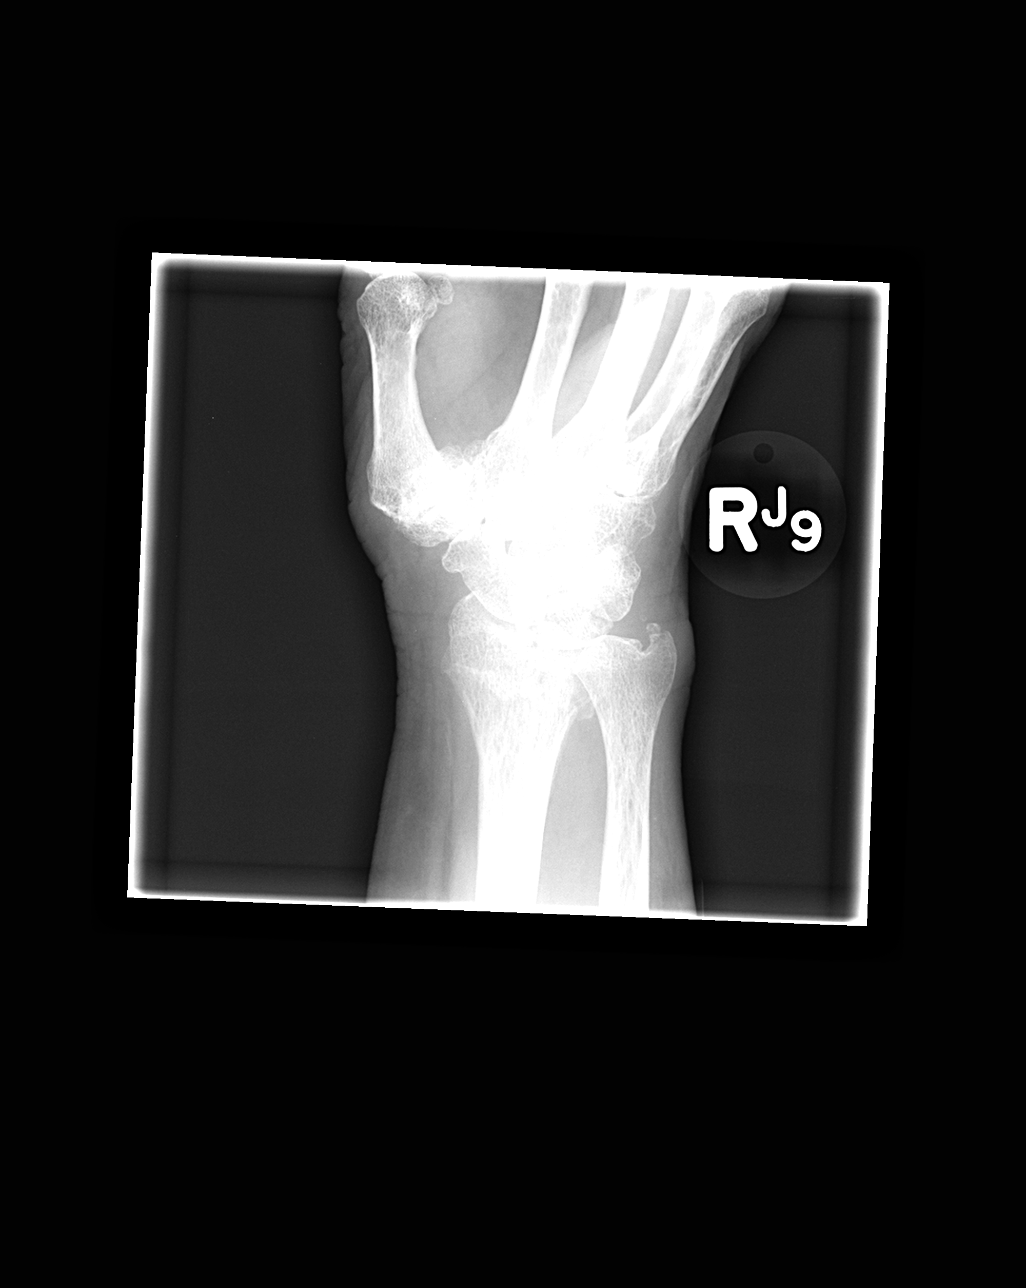

[view not recorded (3 of 4)]
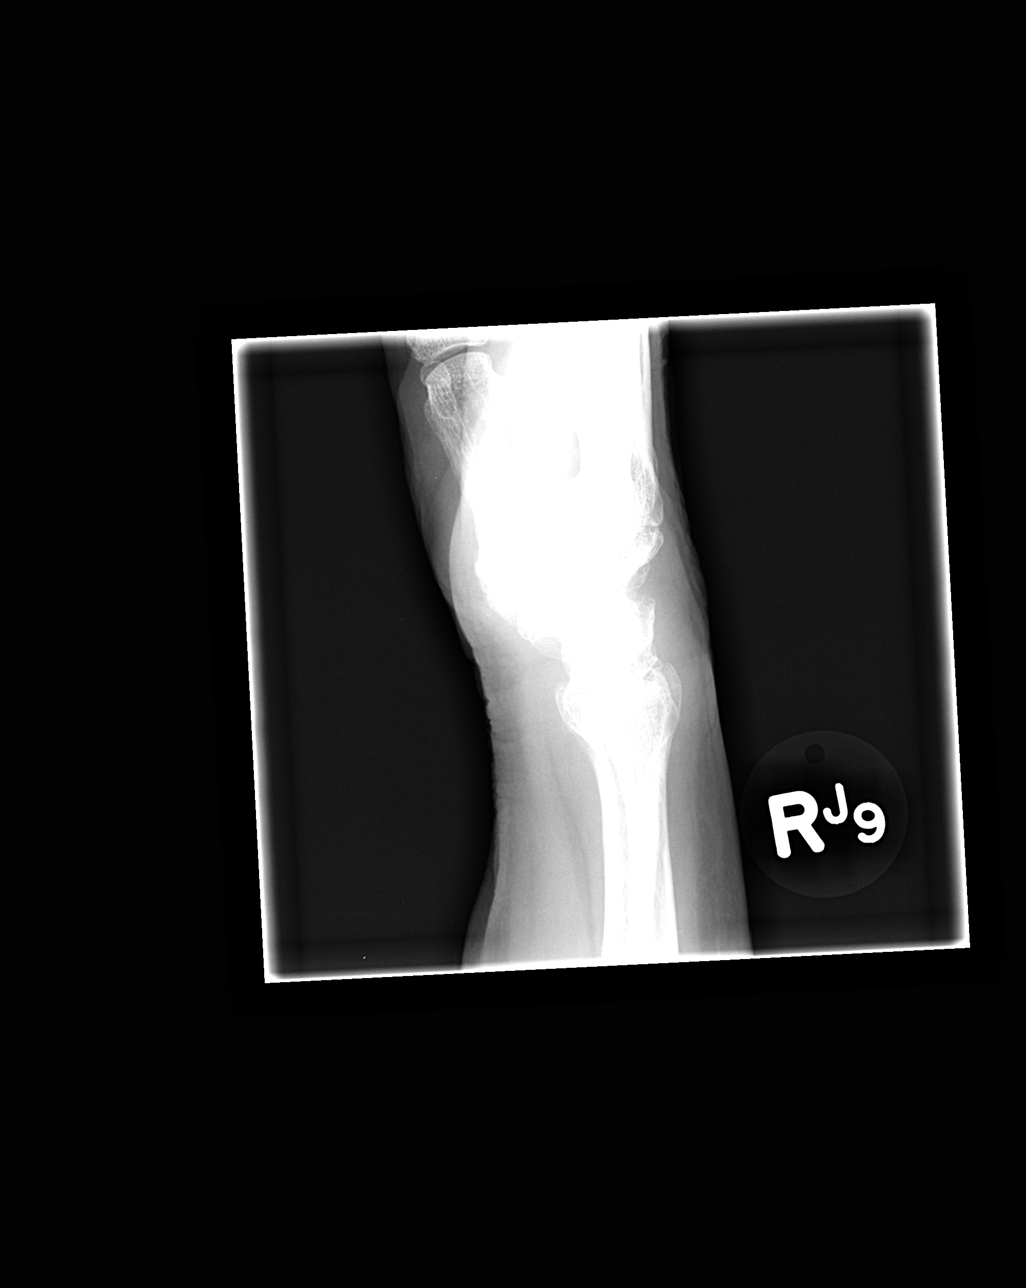

[view not recorded (4 of 4)]
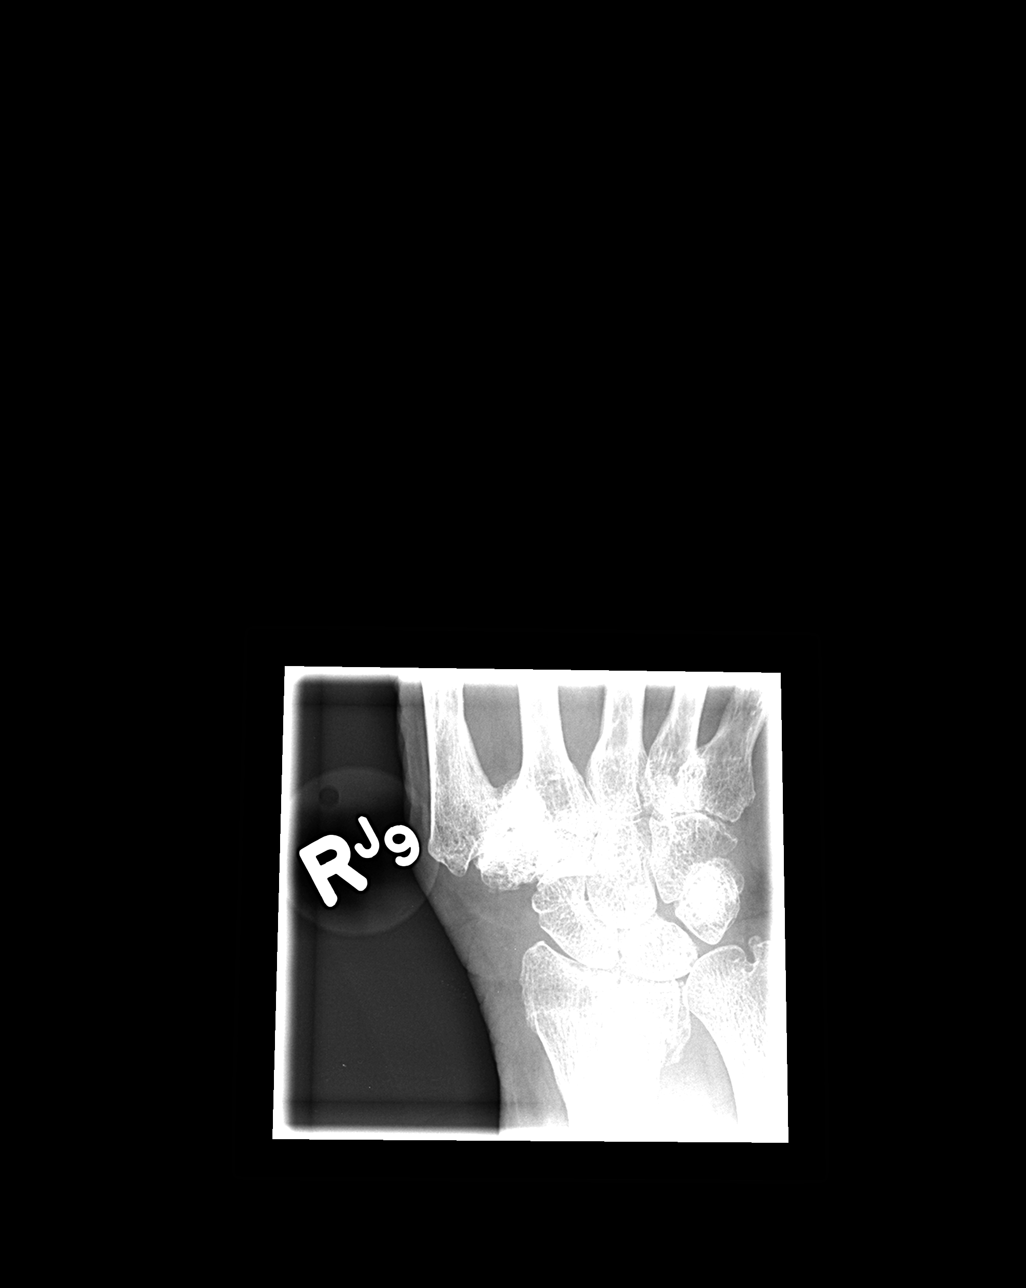

[4 of 4 positions shown; findings below may reference images not displayed]

FINDINGS: Mildly displaced fracture of the distal right radius is noted with
intra-articular extension. Severe narrowing of the 1st
carpometacarpal joint is noted consistent with osteoarthritis. No
soft tissue abnormality is noted. Probable old ulnar styloid
fracture is noted.
IMPRESSION: Mildly displaced and comminuted fracture of distal right radius with
intra-articular extension.

## 2015-10-09 ENCOUNTER — Other Ambulatory Visit (INDEPENDENT_AMBULATORY_CARE_PROVIDER_SITE_OTHER): Payer: Medicare Other

## 2015-10-09 ENCOUNTER — Other Ambulatory Visit: Payer: Self-pay | Admitting: Obstetrics and Gynecology

## 2015-10-09 ENCOUNTER — Encounter: Payer: Self-pay | Admitting: Obstetrics and Gynecology

## 2015-10-09 ENCOUNTER — Ambulatory Visit (INDEPENDENT_AMBULATORY_CARE_PROVIDER_SITE_OTHER): Payer: Medicare Other | Admitting: Obstetrics and Gynecology

## 2015-10-09 ENCOUNTER — Ambulatory Visit: Payer: Medicare Other | Admitting: Obstetrics and Gynecology

## 2015-10-09 VITALS — BP 140/70 | Ht 67.0 in | Wt 109.0 lb

## 2015-10-09 DIAGNOSIS — Z90722 Acquired absence of ovaries, bilateral: Secondary | ICD-10-CM | POA: Diagnosis not present

## 2015-10-09 DIAGNOSIS — R1900 Intra-abdominal and pelvic swelling, mass and lump, unspecified site: Secondary | ICD-10-CM | POA: Diagnosis not present

## 2015-10-09 DIAGNOSIS — Z90711 Acquired absence of uterus with remaining cervical stump: Secondary | ICD-10-CM

## 2015-10-09 DIAGNOSIS — Z01419 Encounter for gynecological examination (general) (routine) without abnormal findings: Secondary | ICD-10-CM | POA: Diagnosis not present

## 2015-10-09 DIAGNOSIS — Z8543 Personal history of malignant neoplasm of ovary: Secondary | ICD-10-CM

## 2015-10-09 NOTE — Progress Notes (Signed)
Patient ID: Tammy Mosley, female   DOB: May 01, 1928, 82 y.o.   MRN: [id number]  Assessment:  Annual Gyn Exam S/p total abdominal hysterectomy BSO for Stage I ovarian cancer >20 yrs ago    Plan:  1. pap smear not done s/p hysterectomy  2. return annually or prn 3    Annual mammogram advised 4. Order abdominal and pelvic US  Subjective:  Tammy Mosley is a 82 y.o. female No obstetric history on file. who presents for annual exam. No LMP recorded. Patient has had a hysterectomy. The patient has complaints today of recent weight loss and decreased appetite.   The following portions of the patient's history were reviewed and updated as appropriate: allergies, current medications, past family history, past medical history, past social history, past surgical history and problem list. Past Medical History:  Diagnosis Date  . Asthma   . Cataracts, bilateral   . Constipation   . Frequent PVCs   . Glaucoma   . Hypothyroidism   . Insomnia   . Ovarian cancer ([place]) 1999  . S/P colonoscopy 2004   Dr. Lizbeth Bark, [facility], diverticulosis and hemorrhoids    Past Surgical History:  Procedure Laterality Date  . BREAST SURGERY     calcification removal, L breast  . EYE SURGERY    . HERNIA REPAIR     X 2  . THYROID SURGERY    . TOE SURGERY     toes on both feet straightened  . TOTAL VAGINAL HYSTERECTOMY     due to ovarian cancer     Current Outpatient Prescriptions:  .  aspirin EC 81 MG tablet, Take 81 mg by mouth daily. Reported on 04/02/2015, Disp: , Rfl:  .  brimonidine-timolol (COMBIGAN) 0.2-0.5 % ophthalmic solution, Place 1 drop into both eyes every 12 (twelve) hours., Disp: , Rfl:  .  donepezil (ARICEPT) 10 MG tablet, Take 1 tablet (10 mg total) by mouth at bedtime., Disp: 30 tablet, Rfl: 5 .  ibuprofen (ADVIL,MOTRIN) 200 MG tablet, Take 200 mg by mouth every 6 (six) hours as needed for pain., Disp: , Rfl:  .  ketoconazole (NIZORAL) 2 % cream, Apply 1 application topically 2 (two) times  daily. To underarms only, Disp: 30 g, Rfl: 0 .  latanoprost (XALATAN) 0.005 % ophthalmic solution, Place 1 drop into the left eye at bedtime., Disp: 2.5 mL, Rfl: 0 .  levothyroxine (SYNTHROID, LEVOTHROID) 100 MCG tablet, Take 1 tablet (100 mcg total) by mouth daily before breakfast., Disp: 90 tablet, Rfl: 0 .  predniSONE (DELTASONE) 20 MG tablet, 2 po qd x 5 d, Disp: 10 tablet, Rfl: 0 .  triamcinolone cream (KENALOG) 0.1 %, Apply 1 application topically 2 (two) times daily. Prn rash on body and underarms; use up to 2 weeks, Disp: 60 g, Rfl: 0  Review of Systems Constitutional: decreased appetite, weight loss Gastrointestinal: negative Genitourinary: negative  Objective:  BP 140/70 (BP Location: Left Arm, Patient Position: Sitting, Cuff Size: Normal)   Ht 5\' 7"  (1.702 m)   Wt 109 lb (49.4 kg)   BMI 17.07 kg/m    BMI: Body mass index is 17.07 kg/m.  General Appearance: Alert, appropriate appearance for age. No acute distress HEENT: Grossly normal Neck / Thyroid:  Cardiovascular: RRR; normal S1, S2, no murmur Lungs: CTA bilaterally Back: No CVAT Breast Exam: not done Gastrointestinal: Soft,no organomegaly, midline lower abdominal scar, well healed from umbilicus to hairline. No appreciable masses. Abdomen non-tender. Aorta is prominent.   Pelvic Exam: Exam deferred. Rectovaginal:  not indicated Lymphatic Exam: Non-palpable nodes in neck, clavicular, axillary, or inguinal regions Skin: no rash or abnormalities Neurologic: Normal gait and speech, no tremor  Psychiatric: Alert and oriented, appropriate affect.  Urinalysis:Not done  Mallory Shirk. MD Pgr [phone] 2:02 PM    By signing my name below, I, Hansel Feinstein, attest that this documentation has been prepared under the direction and in the presence of Jonnie Kind, MD. Electronically Signed: Hansel Feinstein, ED Scribe. 10/09/15. 2:25 PM.  I personally performed the services described in this documentation, which was SCRIBED  in my presence. The recorded information has been reviewed and considered accurate. It has been edited as necessary during review. Jonnie Kind, MD

## 2015-10-09 NOTE — Progress Notes (Signed)
US Pelvis today due to possible palpable pelvic mass.  Uterus and bilateral ovaries are surgically absent.  No abnormality identified.

## 2016-02-19 DIAGNOSIS — L308 Other specified dermatitis: Secondary | ICD-10-CM | POA: Diagnosis not present

## 2016-02-19 DIAGNOSIS — B86 Scabies: Secondary | ICD-10-CM | POA: Diagnosis not present

## 2016-02-21 ENCOUNTER — Encounter (HOSPITAL_COMMUNITY): Payer: Self-pay | Admitting: Emergency Medicine

## 2016-02-21 ENCOUNTER — Emergency Department (HOSPITAL_COMMUNITY): Payer: Medicare Other

## 2016-02-21 ENCOUNTER — Observation Stay (HOSPITAL_COMMUNITY)
Admission: EM | Admit: 2016-02-21 | Discharge: 2016-02-22 | Disposition: A | Discharge status: Home / Self Care | Payer: Medicare Other | Attending: Family Medicine | Admitting: Family Medicine

## 2016-02-21 DIAGNOSIS — E039 Hypothyroidism, unspecified: Secondary | ICD-10-CM | POA: Insufficient documentation

## 2016-02-21 DIAGNOSIS — Z23 Encounter for immunization: Secondary | ICD-10-CM | POA: Insufficient documentation

## 2016-02-21 DIAGNOSIS — Z8543 Personal history of malignant neoplasm of ovary: Secondary | ICD-10-CM | POA: Insufficient documentation

## 2016-02-21 DIAGNOSIS — G9341 Metabolic encephalopathy: Secondary | ICD-10-CM

## 2016-02-21 DIAGNOSIS — G309 Alzheimer's disease, unspecified: Secondary | ICD-10-CM | POA: Insufficient documentation

## 2016-02-21 DIAGNOSIS — R410 Disorientation, unspecified: Secondary | ICD-10-CM | POA: Diagnosis not present

## 2016-02-21 DIAGNOSIS — R4182 Altered mental status, unspecified: Principal | ICD-10-CM | POA: Insufficient documentation

## 2016-02-21 DIAGNOSIS — J45909 Unspecified asthma, uncomplicated: Secondary | ICD-10-CM | POA: Insufficient documentation

## 2016-02-21 DIAGNOSIS — Z87891 Personal history of nicotine dependence: Secondary | ICD-10-CM | POA: Insufficient documentation

## 2016-02-21 DIAGNOSIS — E876 Hypokalemia: Secondary | ICD-10-CM

## 2016-02-21 DIAGNOSIS — Z79899 Other long term (current) drug therapy: Secondary | ICD-10-CM | POA: Insufficient documentation

## 2016-02-21 LAB — URINALYSIS, ROUTINE W REFLEX MICROSCOPIC
BILIRUBIN URINE: NEGATIVE
Glucose, UA: NEGATIVE mg/dL
KETONES UR: 15 mg/dL — AB
Leukocytes, UA: NEGATIVE
NITRITE: NEGATIVE
Specific Gravity, Urine: 1.02 (ref 1.005–1.030)
pH: 6 (ref 5.0–8.0)

## 2016-02-21 LAB — CBG MONITORING, ED: Glucose-Capillary: 68 mg/dL (ref 65–99)

## 2016-02-21 LAB — COMPREHENSIVE METABOLIC PANEL
ALK PHOS: 65 U/L (ref 38–126)
ALT: 13 U/L — AB (ref 14–54)
AST: 28 U/L (ref 15–41)
Albumin: 4.1 g/dL (ref 3.5–5.0)
Anion gap: 10 (ref 5–15)
BILIRUBIN TOTAL: 2.2 mg/dL — AB (ref 0.3–1.2)
BUN: 18 mg/dL (ref 6–20)
CALCIUM: 9 mg/dL (ref 8.9–10.3)
CO2: 24 mmol/L (ref 22–32)
CREATININE: 1.02 mg/dL — AB (ref 0.44–1.00)
Chloride: 104 mmol/L (ref 101–111)
GFR calc Af Amer: 56 mL/min — ABNORMAL LOW (ref >60)
GFR, EST NON AFRICAN AMERICAN: 49 mL/min — AB (ref >60)
Glucose, Bld: 79 mg/dL (ref 65–99)
Potassium: 3.3 mmol/L — ABNORMAL LOW (ref 3.5–5.1)
Sodium: 138 mmol/L (ref 135–145)
Total Protein: 7.2 g/dL (ref 6.5–8.1)

## 2016-02-21 LAB — URINALYSIS, MICROSCOPIC (REFLEX): Squamous Epithelial / LPF: NONE SEEN

## 2016-02-21 LAB — MAGNESIUM: MAGNESIUM: 2.3 mg/dL (ref 1.7–2.4)

## 2016-02-21 LAB — CBC
HEMATOCRIT: 42.5 % (ref 36.0–46.0)
HEMOGLOBIN: 14.2 g/dL (ref 12.0–15.0)
MCH: 29.9 pg (ref 26.0–34.0)
MCHC: 33.4 g/dL (ref 30.0–36.0)
MCV: 89.5 fL (ref 78.0–100.0)
Platelets: 224 10*3/uL (ref 150–400)
RBC: 4.75 MIL/uL (ref 3.87–5.11)
RDW: 14.4 % (ref 11.5–15.5)
WBC: 5.9 10*3/uL (ref 4.0–10.5)

## 2016-02-21 LAB — AMMONIA: AMMONIA: 21 umol/L (ref 9–35)

## 2016-02-21 LAB — VITAMIN B12: Vitamin B-12: 1273 pg/mL — ABNORMAL HIGH (ref 180–914)

## 2016-02-21 LAB — FOLATE: Folate: 44.4 ng/mL (ref >5.9)

## 2016-02-21 LAB — TSH: TSH: 99.185 u[IU]/mL — ABNORMAL HIGH (ref 0.350–4.500)

## 2016-02-21 LAB — PHOSPHORUS: PHOSPHORUS: 3.6 mg/dL (ref 2.5–4.6)

## 2016-02-21 MED ORDER — SODIUM CHLORIDE 0.9 % IV BOLUS (SEPSIS)
1000.0000 mL | Freq: Once | INTRAVENOUS | Status: AC
  Administered 2016-02-21: 1000 mL via INTRAVENOUS

## 2016-02-21 MED ORDER — POTASSIUM CHLORIDE CRYS ER 20 MEQ PO TBCR
40.0000 meq | EXTENDED_RELEASE_TABLET | Freq: Once | ORAL | Status: AC
  Administered 2016-02-21: 40 meq via ORAL
  Filled 2016-02-21: qty 2

## 2016-02-21 MED ORDER — LATANOPROST 0.005 % OP SOLN
1.0000 [drp] | Freq: Every day | OPHTHALMIC | Status: DC
  Administered 2016-02-21: 1 [drp] via OPHTHALMIC
  Filled 2016-02-21: qty 2.5

## 2016-02-21 MED ORDER — BRIMONIDINE TARTRATE 0.2 % OP SOLN
1.0000 [drp] | Freq: Two times a day (BID) | OPHTHALMIC | Status: DC
  Administered 2016-02-21 – 2016-02-22 (×2): 1 [drp] via OPHTHALMIC
  Filled 2016-02-21: qty 5

## 2016-02-21 MED ORDER — DONEPEZIL HCL 5 MG PO TABS
10.0000 mg | ORAL_TABLET | Freq: Every day | ORAL | Status: DC
  Filled 2016-02-21: qty 2

## 2016-02-21 MED ORDER — ENOXAPARIN SODIUM 40 MG/0.4ML ~~LOC~~ SOLN
40.0000 mg | SUBCUTANEOUS | Status: DC
  Administered 2016-02-21: 40 mg via SUBCUTANEOUS
  Filled 2016-02-21: qty 0.4

## 2016-02-21 MED ORDER — LATANOPROST 0.005 % OP SOLN
OPHTHALMIC | Status: AC
  Filled 2016-02-21: qty 2.5

## 2016-02-21 MED ORDER — SODIUM CHLORIDE 0.9 % IV SOLN: 250.0000 mL | INTRAVENOUS | Status: DC | PRN

## 2016-02-21 MED ORDER — ACETAMINOPHEN 650 MG RE SUPP: 650.0000 mg | Freq: Four times a day (QID) | RECTAL | Status: DC | PRN

## 2016-02-21 MED ORDER — BRIMONIDINE TARTRATE-TIMOLOL 0.2-0.5 % OP SOLN: 1.0000 [drp] | Freq: Two times a day (BID) | OPHTHALMIC | Status: DC

## 2016-02-21 MED ORDER — LEVOTHYROXINE SODIUM 100 MCG PO TABS
100.0000 ug | ORAL_TABLET | Freq: Every day | ORAL | Status: DC
  Administered 2016-02-22: 100 ug via ORAL
  Filled 2016-02-21: qty 1

## 2016-02-21 MED ORDER — ENSURE ENLIVE PO LIQD
237.0000 mL | Freq: Two times a day (BID) | ORAL | Status: DC
  Administered 2016-02-21 – 2016-02-22 (×2): 237 mL via ORAL

## 2016-02-21 MED ORDER — INFLUENZA VAC SPLIT QUAD 0.5 ML IM SUSY
0.5000 mL | PREFILLED_SYRINGE | INTRAMUSCULAR | Status: AC
  Administered 2016-02-22: 0.5 mL via INTRAMUSCULAR
  Filled 2016-02-21: qty 0.5

## 2016-02-21 MED ORDER — ACETAMINOPHEN 325 MG PO TABS: 650.0000 mg | ORAL_TABLET | Freq: Four times a day (QID) | ORAL | Status: DC | PRN

## 2016-02-21 MED ORDER — SODIUM CHLORIDE 0.9% FLUSH
3.0000 mL | Freq: Two times a day (BID) | INTRAVENOUS | Status: DC
  Administered 2016-02-21 – 2016-02-22 (×2): 3 mL via INTRAVENOUS

## 2016-02-21 MED ORDER — SODIUM CHLORIDE 0.9% FLUSH: 3.0000 mL | INTRAVENOUS | Status: DC | PRN

## 2016-02-21 MED ORDER — TIMOLOL MALEATE 0.5 % OP SOLN
1.0000 [drp] | Freq: Two times a day (BID) | OPHTHALMIC | Status: DC
  Administered 2016-02-21 – 2016-02-22 (×2): 1 [drp] via OPHTHALMIC
  Filled 2016-02-21: qty 5

## 2016-02-21 NOTE — ED Provider Notes (Signed)
[facility] DEPT Provider Note   CSN: [id number] Arrival date & time: 02/21/16  0730     History   Chief Complaint Chief Complaint  Patient presents with  . Altered Mental Status     Level V caveat: Confusion   HPI Tammy Mosley is a 82 y.o. female. Patient's family reports increasing confusion and lethargy with decreased oral intake over the past 2 days.  Patient has very mild dementia but normally is alert and can answer questions and follow commands.  Family reports that she does not seem to be responding well or answering questions.  They report everything seems to be very delayed for the patient.  No recent injury or trauma.  The patient is compliant with her medications.  She does have hypothyroidism for which she is on Synthroid.  The patient is recently been seen by her primary care physician for a scaly rash to her back for which she was prescribed triamcinolone cream.  She was also placed on prednisone.  No other recent issues.  No reports of vomiting or diarrhea.  No reports of fever.        Past Medical History:  Diagnosis Date  . Asthma   . Cataracts, bilateral   . Constipation   . Frequent PVCs   . Glaucoma   . Hypothyroidism   . Insomnia   . Ovarian cancer ([place]) 1999  . S/P colonoscopy 2004   Dr. Lizbeth Bark, [facility], diverticulosis and hemorrhoids    Patient Active Problem List   Diagnosis Date Noted  . Alzheimer's disease 10/11/2013  . Colles' fracture of right radius 02/02/2013  . Lump or mass in breast 01/05/2013  . Short-term memory loss 12/20/2012  . Asthma with acute exacerbation 07/15/2012  . Hypothyroidism 06/20/2012  . Other and unspecified hyperlipidemia 06/20/2012  . Constipation 06/22/2010  . RECTAL BLEEDING 08/23/2009  . COLONIC POLYPS, HX OF 08/23/2009    Past Surgical History:  Procedure Laterality Date  . BREAST SURGERY     calcification removal, L breast  . EYE SURGERY    . HERNIA REPAIR     X 2  . THYROID SURGERY    . TOE  SURGERY     toes on both feet straightened  . TOTAL VAGINAL HYSTERECTOMY     due to ovarian cancer    OB History    No data available       Home Medications    Prior to Admission medications   Medication Sig Start Date End Date Taking? Authorizing Provider  brimonidine-timolol (COMBIGAN) 0.2-0.5 % ophthalmic solution Place 1 drop into both eyes every 12 (twelve) hours.   Yes Historical Provider, MD  latanoprost (XALATAN) 0.005 % ophthalmic solution Place 1 drop into the left eye at bedtime. 12/25/14  Yes Mikey Kirschner, MD  levothyroxine (SYNTHROID, LEVOTHROID) 100 MCG tablet Take 1 tablet (100 mcg total) by mouth daily before breakfast. 09/27/15  Yes Mikey Kirschner, MD  donepezil (ARICEPT) 10 MG tablet Take 1 tablet (10 mg total) by mouth at bedtime. Patient not taking: Reported on 02/21/2016 12/25/14   Mikey Kirschner, MD  ibuprofen (ADVIL,MOTRIN) 200 MG tablet Take 200 mg by mouth every 6 (six) hours as needed for pain.    Historical Provider, MD  ketoconazole (NIZORAL) 2 % cream Apply 1 application topically 2 (two) times daily. To underarms only Patient not taking: Reported on 02/21/2016 07/05/15   Nilda Simmer, NP  predniSONE (DELTASONE) 20 MG tablet 2 po qd x 5 d Patient  not taking: Reported on 02/21/2016 07/08/15   Nilda Simmer, NP  triamcinolone cream (KENALOG) 0.1 % Apply 1 application topically 2 (two) times daily. Prn rash on body and underarms; use up to 2 weeks Patient not taking: Reported on 02/21/2016 07/05/15   Nilda Simmer, NP    Family History Family History  Problem Relation Age of Onset  . Lung cancer Father 39    deceased  . Emphysema Mother 106    deceased  . Breast cancer Sister     living   . Colon cancer Neg Hx     Social History Social History  Substance Use Topics  . Smoking status: Former Research scientist (life sciences)  . Smokeless tobacco: Never Used  . Alcohol use No     Allergies   Patient has no known allergies.   Review of Systems Review of  Systems  Unable to perform ROS: Mental status change     Physical Exam Updated Vital Signs BP 168/89   Pulse 64   Temp 99.3 F (37.4 C) (Rectal)   Resp 15   Ht 5\' 8"  (1.727 m)   Wt 100 lb (45.4 kg)   SpO2 98%   BMI 15.20 kg/m   Physical Exam  Constitutional: She appears well-developed and well-nourished. No distress.  HENT:  Head: Normocephalic and atraumatic.  Eyes: EOM are normal.  Neck: Normal range of motion.  Cardiovascular: Normal rate, regular rhythm and normal heart sounds.   Pulmonary/Chest: Effort normal and breath sounds normal.  Abdominal: Soft. She exhibits no distension. There is no tenderness.  Musculoskeletal: Normal range of motion.  Neurological: She is alert.  Follows commands intermittently.  Is very delayed in her responses both physically and verbally.  Moves all 4 extremities equally.  No dysarthria or facial asymmetry noted  Skin: Skin is warm and dry.  Scaly rash to her back without erythema  Psychiatric: She has a normal mood and affect. Judgment normal.  Nursing note and vitals reviewed.    ED Treatments / Results  Labs (all labs ordered are listed, but only abnormal results are displayed) Labs Reviewed  COMPREHENSIVE METABOLIC PANEL - Abnormal; Notable for the following:       Result Value   Potassium 3.3 (*)    Creatinine, Ser 1.02 (*)    ALT 13 (*)    Total Bilirubin 2.2 (*)    GFR calc non Af Amer 49 (*)    GFR calc Af Amer 56 (*)    All other components within normal limits  URINALYSIS, ROUTINE W REFLEX MICROSCOPIC - Abnormal; Notable for the following:    APPearance HAZY (*)    Hgb urine dipstick SMALL (*)    Ketones, ur 15 (*)    Protein, ur TRACE (*)    All other components within normal limits  TSH - Abnormal; Notable for the following:    TSH 99.185 (*)    All other components within normal limits  URINALYSIS, MICROSCOPIC (REFLEX) - Abnormal; Notable for the following:    Bacteria, UA RARE (*)    All other components  within normal limits  CBC    EKG  EKG Interpretation  Date/Time:  Friday February 21 2016 07:39:10 EST Ventricular Rate:  63 PR Interval:    QRS Duration: 95 QT Interval:  431 QTC Calculation: 442 R Axis:   70 Text Interpretation:  Sinus rhythm Nonspecific T abnormalities, lateral leads No significant change was found Confirmed by Jovan Colligan  MD, Luvinia Lucy ([id number]) on 02/21/2016 7:47:03 AM  Radiology Dg Chest 2 View  Result Date: 02/21/2016 CLINICAL DATA:  Altered mental status. EXAM: CHEST  2 VIEW COMPARISON:  Radiographs of September 18, 2014. FINDINGS: The heart size and mediastinal contours are within normal limits. Atherosclerosis of thoracic aorta is noted. Both lungs are clear. No pneumothorax or pleural effusion is noted. Severe narrowing of the subacromial space is noted bilaterally suggesting rotator cuff injury. IMPRESSION: No active cardiopulmonary disease.  Aortic atherosclerosis. Electronically Signed   By: Marijo Conception, M.D.   On: 02/21/2016 09:25   Ct Head Wo Contrast  Result Date: 02/21/2016 CLINICAL DATA:  Increased confusion and lethargy over the past few days EXAM: CT HEAD WITHOUT CONTRAST TECHNIQUE: Contiguous axial images were obtained from the base of the skull through the vertex without intravenous contrast. COMPARISON:  04/28/2013 FINDINGS: Brain: No evidence of acute infarction, hemorrhage, hydrocephalus, extra-axial collection or mass lesion/mass effect. Mild atrophic changes as well as mild chronic white matter ischemic change are seen. Vascular: No hyperdense vessel or unexpected calcification. Skull: Normal. Negative for fracture or focal lesion. Sinuses/Orbits: No acute finding. Other: None. IMPRESSION: Mild atrophic changes and chronic white matter ischemic change stable from the prior exam. No acute abnormality noted. Electronically Signed   By: Inez Catalina M.D.   On: 02/21/2016 10:57    Procedures Procedures (including critical care time)  Medications  Ordered in ED Medications  sodium chloride 0.9 % bolus 1,000 mL (0 mLs Intravenous Stopped 02/21/16 0932)     Initial Impression / Assessment and Plan / ED Course  I have reviewed the triage vital signs and the nursing notes.  Pertinent labs & imaging results that were available during my care of the patient were reviewed by me and considered in my medical decision making (see chart for details).  Clinical Course    Ongoing confusion and difficulty following commands.  Could represent a receptive aphasia.  Patient will undergo MRI now.  I spoke with the hospitalist for admission as reported that the MRI will help with disposition including hospital for which she is disposition 2.  Final Clinical Impressions(s) / ED Diagnoses   Final diagnoses:  None    New Prescriptions New Prescriptions   No medications on file     Jola Schmidt, MD 02/21/16 1240

## 2016-02-21 NOTE — ED Triage Notes (Signed)
Pt family reprots Increased confusion and lethargy since yesterday. Pt alert to self and place. nad noted.

## 2016-02-21 NOTE — ED Notes (Signed)
Patient transported back to room from radiology. [place] notified.

## 2016-02-21 NOTE — ED Notes (Signed)
Pt's daughter worried about decreased responsiveness.  Pt still able to state name and she is at the hospital.  CBG obtained and coke given.

## 2016-02-21 NOTE — H&P (Signed)
History and Physical    Tammy Mosley [id number] DOB: 28-Feb-1929 DOA: 02/21/2016  PCP: Mickie Hillier, MD   Chief Complaint: Altered mental status  HPI: Tammy Mosley is a 82 y.o. female with medical history significant of unable to obtain history from patient as she has dementia and is confused. No family at bedside. History is obtained from chart and ED physician. Reportedly patient has not been able to hold conversations which is not normal for her. As such she was brought to the hospital for further evaluation recommendations.  ED Course: Patient was found to have elevated TSH other workup negative which included CT scan of head/MRI of brain.  Review of Systems: Unable to assess due to dementia  Past Medical History:  Diagnosis Date  . Asthma   . Cataracts, bilateral   . Constipation   . Frequent PVCs   . Glaucoma   . Hypothyroidism   . Insomnia   . Ovarian cancer ([place]) 1999  . S/P colonoscopy 2004   Dr. Lizbeth Bark, [facility], diverticulosis and hemorrhoids    Past Surgical History:  Procedure Laterality Date  . BREAST SURGERY     calcification removal, L breast  . EYE SURGERY    . HERNIA REPAIR     X 2  . THYROID SURGERY    . TOE SURGERY     toes on both feet straightened  . TOTAL VAGINAL HYSTERECTOMY     due to ovarian cancer     reports that she has quit smoking. She has never used smokeless tobacco. She reports that she does not drink alcohol or use drugs.  No Known Allergies  Family History  Problem Relation Age of Onset  . Lung cancer Father 8    deceased  . Emphysema Mother 66    deceased  . Breast cancer Sister     living   . Colon cancer Neg Hx     Prior to Admission medications   Medication Sig Start Date End Date Taking? Authorizing Provider  brimonidine-timolol (COMBIGAN) 0.2-0.5 % ophthalmic solution Place 1 drop into both eyes every 12 (twelve) hours.   Yes Historical Provider, MD  latanoprost (XALATAN) 0.005 % ophthalmic solution Place 1 drop  into the left eye at bedtime. 12/25/14  Yes Mikey Kirschner, MD  levothyroxine (SYNTHROID, LEVOTHROID) 100 MCG tablet Take 1 tablet (100 mcg total) by mouth daily before breakfast. 09/27/15  Yes Mikey Kirschner, MD  donepezil (ARICEPT) 10 MG tablet Take 1 tablet (10 mg total) by mouth at bedtime. Patient not taking: Reported on 02/21/2016 12/25/14   Mikey Kirschner, MD  ibuprofen (ADVIL,MOTRIN) 200 MG tablet Take 200 mg by mouth every 6 (six) hours as needed for pain.    Historical Provider, MD  ketoconazole (NIZORAL) 2 % cream Apply 1 application topically 2 (two) times daily. To underarms only Patient not taking: Reported on 02/21/2016 07/05/15   Nilda Simmer, NP  predniSONE (DELTASONE) 20 MG tablet 2 po qd x 5 d Patient not taking: Reported on 02/21/2016 07/08/15   Nilda Simmer, NP  triamcinolone cream (KENALOG) 0.1 % Apply 1 application topically 2 (two) times daily. Prn rash on body and underarms; use up to 2 weeks Patient not taking: Reported on 02/21/2016 07/05/15   Nilda Simmer, NP    Physical Exam: Vitals:   02/21/16 1100 02/21/16 1130 02/21/16 1200 02/21/16 1230  BP: 168/89 149/76 159/75 162/83  Pulse:      Resp: 15 19 19 18   Temp:  TempSrc:      SpO2:      Weight:      Height:          Constitutional: NAD, calm, comfortable Vitals:   02/21/16 1100 02/21/16 1130 02/21/16 1200 02/21/16 1230  BP: 168/89 149/76 159/75 162/83  Pulse:      Resp: 15 19 19 18   Temp:      TempSrc:      SpO2:      Weight:      Height:       Eyes: PERRL, lids and conjunctivae normal ENMT: Mucous membranes are Dry. Posterior pharynx clear of any exudate or lesions. Neck: normal, supple, no masses, no thyromegaly Respiratory: clear to auscultation bilaterally, no wheezing, no crackles. Normal respiratory effort. No accessory muscle use.  Cardiovascular: Regular rate and rhythm, no murmurs / rubs / gallops. No extremity edema.  Abdomen: no tenderness, no masses palpated. No  hepatosplenomegaly. Bowel sounds positive.  Musculoskeletal: no clubbing / cyanosis. No joint deformity upper and lower extremities.  Skin: no rashes, lesions, ulcers. No induration on limited exam Neurologic: No facial asymmetry, Limited cooperation with examiner, moves extremities equally  Psychiatric: Unable to accurately assess secondary to limited cooperation with examiner   Labs on Admission: I have personally reviewed following labs and imaging studies  CBC:  Recent Labs Lab 02/21/16 0808  WBC 5.9  HGB 14.2  HCT 42.5  MCV 89.5  PLT [id number]   Basic Metabolic Panel:  Recent Labs Lab 02/21/16 0808  NA 138  K 3.3*  CL 104  CO2 24  GLUCOSE 79  BUN 18  CREATININE 1.02*  CALCIUM 9.0   GFR: Estimated Creatinine Clearance: 28.9 mL/min (by C-G formula based on SCr of 1.02 mg/dL (H)). Liver Function Tests:  Recent Labs Lab 02/21/16 0808  AST 28  ALT 13*  ALKPHOS 65  BILITOT 2.2*  PROT 7.2  ALBUMIN 4.1   No results for input(s): LIPASE, AMYLASE in the last 168 hours. No results for input(s): AMMONIA in the last 168 hours. Coagulation Profile: No results for input(s): INR, PROTIME in the last 168 hours. Cardiac Enzymes: No results for input(s): CKTOTAL, CKMB, CKMBINDEX, TROPONINI in the last 168 hours. BNP (last 3 results) No results for input(s): PROBNP in the last 8760 hours. HbA1C: No results for input(s): HGBA1C in the last 72 hours. CBG: No results for input(s): GLUCAP in the last 168 hours. Lipid Profile: No results for input(s): CHOL, HDL, LDLCALC, TRIG, CHOLHDL, LDLDIRECT in the last 72 hours. Thyroid Function Tests:  Recent Labs  02/21/16 0810  TSH 99.185*   Anemia Panel: No results for input(s): VITAMINB12, FOLATE, FERRITIN, TIBC, IRON, RETICCTPCT in the last 72 hours. Urine analysis:    Component Value Date/Time   COLORURINE YELLOW 02/21/2016 0820   APPEARANCEUR HAZY (A) 02/21/2016 0820   LABSPEC 1.020 02/21/2016 0820   PHURINE 6.0  02/21/2016 0820   GLUCOSEU NEGATIVE 02/21/2016 0820   HGBUR SMALL (A) 02/21/2016 0820   BILIRUBINUR NEGATIVE 02/21/2016 0820   KETONESUR 15 (A) 02/21/2016 0820   PROTEINUR TRACE (A) 02/21/2016 0820   NITRITE NEGATIVE 02/21/2016 0820   LEUKOCYTESUR NEGATIVE 02/21/2016 0820   Sepsis Labs: !!!!!!!!!!!!!!!!!!!!!!!!!!!!!!!!!!!!!!!!!!!! @LABRCNTIP (procalcitonin:4,lacticidven:4) )No results found for this or any previous visit (from the past 240 hour(s)).   Radiological Exams on Admission: Dg Chest 2 View  Result Date: 02/21/2016 CLINICAL DATA:  Altered mental status. EXAM: CHEST  2 VIEW COMPARISON:  Radiographs of September 18, 2014. FINDINGS: The heart size and mediastinal contours  are within normal limits. Atherosclerosis of thoracic aorta is noted. Both lungs are clear. No pneumothorax or pleural effusion is noted. Severe narrowing of the subacromial space is noted bilaterally suggesting rotator cuff injury. IMPRESSION: No active cardiopulmonary disease.  Aortic atherosclerosis. Electronically Signed   By: Marijo Conception, M.D.   On: 02/21/2016 09:25   Ct Head Wo Contrast  Result Date: 02/21/2016 CLINICAL DATA:  Increased confusion and lethargy over the past few days EXAM: CT HEAD WITHOUT CONTRAST TECHNIQUE: Contiguous axial images were obtained from the base of the skull through the vertex without intravenous contrast. COMPARISON:  04/28/2013 FINDINGS: Brain: No evidence of acute infarction, hemorrhage, hydrocephalus, extra-axial collection or mass lesion/mass effect. Mild atrophic changes as well as mild chronic white matter ischemic change are seen. Vascular: No hyperdense vessel or unexpected calcification. Skull: Normal. Negative for fracture or focal lesion. Sinuses/Orbits: No acute finding. Other: None. IMPRESSION: Mild atrophic changes and chronic white matter ischemic change stable from the prior exam. No acute abnormality noted. Electronically Signed   By: Inez Catalina M.D.   On: 02/21/2016  10:57   Mr Brain Wo Contrast  Result Date: 02/21/2016 CLINICAL DATA:  82 y/o F; increased confusion with lethargy and receptive aphasia. EXAM: MRI HEAD WITHOUT CONTRAST TECHNIQUE: Multiplanar, multiecho pulse sequences of the brain and surrounding structures were obtained without intravenous contrast. COMPARISON:  02/21/2016 CT head. FINDINGS: Brain: No diffusion signal abnormality. Extensive nonspecific patchy foci of T2 FLAIR hyperintensity throughout subcortical and periventricular white matter, small foci scattered throughout basal ganglia and pons compatible with moderate chronic microvascular ischemic changes. Mild brain parenchymal volume loss. There are a few punctate foci of susceptibility hypointensity, for example in the pons and left lentiform nucleus, compatible with hemosiderin deposition of bold microhemorrhage. Vascular: Large left V4 segment, anatomic variant versus ectasia. Skull and upper cervical spine: Normal marrow signal. Sinuses/Orbits: Small right mastoid effusion. No abnormal signal of left mastoid air cells. Mild ethmoid sinus mucosal thickening. Bilateral intra-ocular lens replacement. Other: None. IMPRESSION: 1. No acute intracranial abnormality is identified. 2. Moderate chronic microvascular ischemic changes and mild brain parenchymal volume loss. 3. Prominent left vertebral artery V4 segment, anatomic variant versus ectasia. 4. Small right mastoid effusion and mild paranasal sinus disease. Electronically Signed   By: Kristine Garbe M.D.   On: 02/21/2016 14:27    EKG: Independently reviewed. Sinus rhythm with no ST elevations or depressions  Assessment/Plan Active Problems:   Metabolic encephalopathy -Most likely secondary to noncompliance with thyroid replacement medication regimen - Obtain further workup please refer to orders for details - CT of head MRI of brain negative  Hypokalemia - replace orally and reassess next am. - place order for magnesium  level  Hypothyroidism -Continue home medication regimen. Most likely cause of principal problem  Dementia - stable continue home medication regimen  DVT prophylaxis: Lovenox Code Status: Presumed full Family Communication: None at bedside Disposition Plan: Observation Consults called: None Admission status: Alinda Sierras MD [facility] Pager [phone]  If 7PM-7AM, please contact night-coverage [email or web address] Password [email or web address]  02/21/2016, 3:08 PM

## 2016-02-22 DIAGNOSIS — G9341 Metabolic encephalopathy: Secondary | ICD-10-CM | POA: Diagnosis not present

## 2016-02-22 DIAGNOSIS — R4182 Altered mental status, unspecified: Secondary | ICD-10-CM | POA: Diagnosis not present

## 2016-02-22 LAB — CBC
HCT: 47.4 % — ABNORMAL HIGH (ref 36.0–46.0)
Hemoglobin: 15.5 g/dL — ABNORMAL HIGH (ref 12.0–15.0)
MCH: 30.2 pg (ref 26.0–34.0)
MCHC: 32.7 g/dL (ref 30.0–36.0)
MCV: 92.2 fL (ref 78.0–100.0)
PLATELETS: 254 10*3/uL (ref 150–400)
RBC: 5.14 MIL/uL — AB (ref 3.87–5.11)
RDW: 14.6 % (ref 11.5–15.5)
WBC: 6.4 10*3/uL (ref 4.0–10.5)

## 2016-02-22 LAB — BASIC METABOLIC PANEL
ANION GAP: 10 (ref 5–15)
BUN: 21 mg/dL — AB (ref 6–20)
CALCIUM: 9.1 mg/dL (ref 8.9–10.3)
CO2: 26 mmol/L (ref 22–32)
Chloride: 101 mmol/L (ref 101–111)
Creatinine, Ser: 1.06 mg/dL — ABNORMAL HIGH (ref 0.44–1.00)
GFR calc Af Amer: 54 mL/min — ABNORMAL LOW (ref >60)
GFR, EST NON AFRICAN AMERICAN: 47 mL/min — AB (ref >60)
GLUCOSE: 89 mg/dL (ref 65–99)
POTASSIUM: 3.7 mmol/L (ref 3.5–5.1)
Sodium: 137 mmol/L (ref 135–145)

## 2016-02-22 MED ORDER — SODIUM CHLORIDE 0.9 % IV BOLUS (SEPSIS)
1000.0000 mL | Freq: Once | INTRAVENOUS | Status: AC
  Administered 2016-02-22: 1000 mL via INTRAVENOUS

## 2016-02-22 NOTE — Discharge Summary (Signed)
Physician Discharge Summary  Tammy Mosley [id number] DOB: 1928/10/25 DOA: 02/21/2016  PCP: Mickie Hillier, MD  Admit date: 02/21/2016 Discharge date: 02/22/2016  Time spent: > 35 minutes  Recommendations for Outpatient Follow-up:  1. Please ensure patient has compliance with thyroid medication replacement therapy 2. CT scan and MRI of head negative   Discharge Diagnoses:  Active Problems:   Metabolic encephalopathy   Hypokalemia   Discharge Condition: stable  Diet recommendation: regular diet  Filed Weights   02/21/16 0739 02/21/16 1600  Weight: 45.4 kg (100 lb) 57.2 kg (126 lb)    History of present illness:  82 year old with history of hypothyroidism and dementia who presented with elevated TSH and dehydration to the hospital. Family reports patient had altered mental status which has resolved on day of discharge  Hospital Course:  Metabolic encephalopathy - Improved with IV fluid rehydration and continuation of thyroid medication - I suspect patient's metabolic encephalopathy secondary to dehydration and hypothyroidism.  For other known medical conditions listed above continue home medication regimen listed below  Procedures:  As mentioned above  Consultations:  None  Discharge Exam: Vitals:   02/21/16 2238 02/22/16 0400  BP: (!) 159/71 (!) 142/71  Pulse: 68 62  Resp: 15 16  Temp: 98.2 F (36.8 C) 98.8 F (37.1 C)    General: Patient in no acute distress, alert and awake Cardiovascular: Regular rate and rhythm, no murmurs rubs Respiratory: No increased work of breathing, no wheezes  Discharge Instructions   Discharge Instructions    Call MD for:  difficulty breathing, headache or visual disturbances    Complete by:  As directed    Call MD for:  temperature >100.4    Complete by:  As directed    Diet - low sodium heart healthy    Complete by:  As directed    Discharge instructions    Complete by:  As directed    Please follow up with your  primary care physician in 1-2 weeks or sooner should any new concerns arise.   Increase activity slowly    Complete by:  As directed      Current Discharge Medication List    CONTINUE these medications which have NOT CHANGED   Details  brimonidine-timolol (COMBIGAN) 0.2-0.5 % ophthalmic solution Place 1 drop into both eyes every 12 (twelve) hours.    latanoprost (XALATAN) 0.005 % ophthalmic solution Place 1 drop into the left eye at bedtime. Qty: 2.5 mL, Refills: 0    levothyroxine (SYNTHROID, LEVOTHROID) 100 MCG tablet Take 1 tablet (100 mcg total) by mouth daily before breakfast. Qty: 90 tablet, Refills: 0    donepezil (ARICEPT) 10 MG tablet Take 1 tablet (10 mg total) by mouth at bedtime. Qty: 30 tablet, Refills: 5    ketoconazole (NIZORAL) 2 % cream Apply 1 application topically 2 (two) times daily. To underarms only Qty: 30 g, Refills: 0    triamcinolone cream (KENALOG) 0.1 % Apply 1 application topically 2 (two) times daily. Prn rash on body and underarms; use up to 2 weeks Qty: 60 g, Refills: 0      STOP taking these medications     ibuprofen (ADVIL,MOTRIN) 200 MG tablet      predniSONE (DELTASONE) 20 MG tablet        No Known Allergies    The results of significant diagnostics from this hospitalization (including imaging, microbiology, ancillary and laboratory) are listed below for reference.    Significant Diagnostic Studies: Dg Chest 2 View  Result Date:  02/21/2016 CLINICAL DATA:  Altered mental status. EXAM: CHEST  2 VIEW COMPARISON:  Radiographs of September 18, 2014. FINDINGS: The heart size and mediastinal contours are within normal limits. Atherosclerosis of thoracic aorta is noted. Both lungs are clear. No pneumothorax or pleural effusion is noted. Severe narrowing of the subacromial space is noted bilaterally suggesting rotator cuff injury. IMPRESSION: No active cardiopulmonary disease.  Aortic atherosclerosis. Electronically Signed   By: Marijo Conception, M.D.    On: 02/21/2016 09:25   Ct Head Wo Contrast  Result Date: 02/21/2016 CLINICAL DATA:  Increased confusion and lethargy over the past few days EXAM: CT HEAD WITHOUT CONTRAST TECHNIQUE: Contiguous axial images were obtained from the base of the skull through the vertex without intravenous contrast. COMPARISON:  04/28/2013 FINDINGS: Brain: No evidence of acute infarction, hemorrhage, hydrocephalus, extra-axial collection or mass lesion/mass effect. Mild atrophic changes as well as mild chronic white matter ischemic change are seen. Vascular: No hyperdense vessel or unexpected calcification. Skull: Normal. Negative for fracture or focal lesion. Sinuses/Orbits: No acute finding. Other: None. IMPRESSION: Mild atrophic changes and chronic white matter ischemic change stable from the prior exam. No acute abnormality noted. Electronically Signed   By: Inez Catalina M.D.   On: 02/21/2016 10:57   Mr Brain Wo Contrast  Result Date: 02/21/2016 CLINICAL DATA:  82 y/o F; increased confusion with lethargy and receptive aphasia. EXAM: MRI HEAD WITHOUT CONTRAST TECHNIQUE: Multiplanar, multiecho pulse sequences of the brain and surrounding structures were obtained without intravenous contrast. COMPARISON:  02/21/2016 CT head. FINDINGS: Brain: No diffusion signal abnormality. Extensive nonspecific patchy foci of T2 FLAIR hyperintensity throughout subcortical and periventricular white matter, small foci scattered throughout basal ganglia and pons compatible with moderate chronic microvascular ischemic changes. Mild brain parenchymal volume loss. There are a few punctate foci of susceptibility hypointensity, for example in the pons and left lentiform nucleus, compatible with hemosiderin deposition of bold microhemorrhage. Vascular: Large left V4 segment, anatomic variant versus ectasia. Skull and upper cervical spine: Normal marrow signal. Sinuses/Orbits: Small right mastoid effusion. No abnormal signal of left mastoid air cells.  Mild ethmoid sinus mucosal thickening. Bilateral intra-ocular lens replacement. Other: None. IMPRESSION: 1. No acute intracranial abnormality is identified. 2. Moderate chronic microvascular ischemic changes and mild brain parenchymal volume loss. 3. Prominent left vertebral artery V4 segment, anatomic variant versus ectasia. 4. Small right mastoid effusion and mild paranasal sinus disease. Electronically Signed   By: Kristine Garbe M.D.   On: 02/21/2016 14:27    Microbiology: No results found for this or any previous visit (from the past 240 hour(s)).   Labs: Basic Metabolic Panel:  Recent Labs Lab 02/21/16 0808 02/21/16 1620 02/22/16 0554  NA 138  --  137  K 3.3*  --  3.7  CL 104  --  101  CO2 24  --  26  GLUCOSE 79  --  89  BUN 18  --  21*  CREATININE 1.02*  --  1.06*  CALCIUM 9.0  --  9.1  MG  --  2.3  --   PHOS  --  3.6  --    Liver Function Tests:  Recent Labs Lab 02/21/16 0808  AST 28  ALT 13*  ALKPHOS 65  BILITOT 2.2*  PROT 7.2  ALBUMIN 4.1   No results for input(s): LIPASE, AMYLASE in the last 168 hours.  Recent Labs Lab 02/21/16 1511  AMMONIA 21   CBC:  Recent Labs Lab 02/21/16 0808 02/22/16 0554  WBC 5.9 6.4  HGB 14.2 15.5*  HCT 42.5 47.4*  MCV 89.5 92.2  PLT 224 254   Cardiac Enzymes: No results for input(s): CKTOTAL, CKMB, CKMBINDEX, TROPONINI in the last 168 hours. BNP: BNP (last 3 results) No results for input(s): BNP in the last 8760 hours.  ProBNP (last 3 results) No results for input(s): PROBNP in the last 8760 hours.  CBG:  Recent Labs Lab 02/21/16 1604  GLUCAP 68    Signed:  Velvet Bathe MD.  [facility] 02/22/2016, 10:41 AM

## 2016-02-24 DIAGNOSIS — Z9181 History of falling: Secondary | ICD-10-CM | POA: Diagnosis not present

## 2016-02-24 DIAGNOSIS — F0390 Unspecified dementia without behavioral disturbance: Secondary | ICD-10-CM | POA: Diagnosis not present

## 2016-02-24 DIAGNOSIS — M6281 Muscle weakness (generalized): Secondary | ICD-10-CM | POA: Diagnosis not present

## 2016-02-24 DIAGNOSIS — E039 Hypothyroidism, unspecified: Secondary | ICD-10-CM | POA: Diagnosis not present

## 2016-02-25 DIAGNOSIS — F0390 Unspecified dementia without behavioral disturbance: Secondary | ICD-10-CM | POA: Diagnosis not present

## 2016-02-25 DIAGNOSIS — M6281 Muscle weakness (generalized): Secondary | ICD-10-CM | POA: Diagnosis not present

## 2016-02-25 DIAGNOSIS — Z9181 History of falling: Secondary | ICD-10-CM | POA: Diagnosis not present

## 2016-02-25 DIAGNOSIS — E039 Hypothyroidism, unspecified: Secondary | ICD-10-CM | POA: Diagnosis not present

## 2016-02-26 LAB — HIV ANTIBODY (ROUTINE TESTING W REFLEX): HIV Screen 4th Generation wRfx: NONREACTIVE

## 2016-02-26 LAB — RPR: RPR Ser Ql: NONREACTIVE

## 2016-02-27 DIAGNOSIS — Z9181 History of falling: Secondary | ICD-10-CM | POA: Diagnosis not present

## 2016-02-27 DIAGNOSIS — E039 Hypothyroidism, unspecified: Secondary | ICD-10-CM | POA: Diagnosis not present

## 2016-02-27 DIAGNOSIS — F0390 Unspecified dementia without behavioral disturbance: Secondary | ICD-10-CM | POA: Diagnosis not present

## 2016-02-27 DIAGNOSIS — M6281 Muscle weakness (generalized): Secondary | ICD-10-CM | POA: Diagnosis not present

## 2016-02-28 ENCOUNTER — Encounter: Payer: Self-pay | Admitting: Family Medicine

## 2016-02-28 ENCOUNTER — Ambulatory Visit (INDEPENDENT_AMBULATORY_CARE_PROVIDER_SITE_OTHER): Payer: Medicare Other | Admitting: Family Medicine

## 2016-02-28 VITALS — BP 132/86 | Ht 67.0 in | Wt 124.0 lb

## 2016-02-28 DIAGNOSIS — F0280 Dementia in other diseases classified elsewhere without behavioral disturbance: Secondary | ICD-10-CM | POA: Diagnosis not present

## 2016-02-28 DIAGNOSIS — E039 Hypothyroidism, unspecified: Secondary | ICD-10-CM

## 2016-02-28 DIAGNOSIS — G301 Alzheimer's disease with late onset: Secondary | ICD-10-CM

## 2016-02-28 MED ORDER — LEVOTHYROXINE SODIUM 100 MCG PO TABS: 100.0000 ug | ORAL_TABLET | Freq: Every day | ORAL | 1 refills | Status: DC

## 2016-02-28 NOTE — Progress Notes (Signed)
   Subjective:    Patient ID: Tammy Mosley, female    DOB: 02/27/1929, 82 y.o.   MRN: [id number]  HPI Patient arrives for a follow up from the ER for altered mental status. The ER stated her thyroid levels were really off because she forgot to take her meds.  Of note patient was contacted within 2 business days following discharge from hospital. Appropriate follow-up was clarified. Compliance with medication was clarified. Status of patient was clarified at that time. See note nurses message.  Today The sick complete hospital record along with all imaging is reviewed with patient and family along with blood work results.  Patient's daughter-in-law who accompanies her today states that she artery is significantly improved with family making sure patient is on medicines. Somewhat challenging situation. Husband has his own his health issues physical and mental so has been having difficulty with compliance. The family has stepped up. Visiting the patient daily making sure she gets her medications.  Today the patient reports she is feeling fine no headache or chest pain. Compliance meds. Appetite decent. Next  Reports no recent falls or trouble with depression  Daughter has noticed a know under each arm pit she would liked checked. Review of Systems No headache, no major weight loss or weight gain, no chest pain no back pain abdominal pain no change in bowel habits complete ROS otherwise negative     Objective:   Physical Exam  Alert vitals stable, NAD. Blood pressure good on repeat. HEENT normal. Lungs clear. Heart regular rate and rhythm. Bilateral axillary subcutaneous cyst palpated. No lymphadenopathy exam otherwise normal      Assessment & Plan:  Impression 1 hypothyroidism with extremely high TSH recently and profound challenges with compliance #2 noncompliance discussed. Top located by 1 but also complicated by #3 #3 dementia. Ongoing. Patient compliant with her medications. Recent  scan showed no additional diagnosis in this regard plan compliance issues discussed with family. Repeat TSH in 6 weeks rationale discussed will not require separate office visit. Family to work closer with patient follow-up as scheduled [facility]

## 2016-03-02 DIAGNOSIS — F0390 Unspecified dementia without behavioral disturbance: Secondary | ICD-10-CM | POA: Diagnosis not present

## 2016-03-02 DIAGNOSIS — M6281 Muscle weakness (generalized): Secondary | ICD-10-CM | POA: Diagnosis not present

## 2016-03-02 DIAGNOSIS — Z9181 History of falling: Secondary | ICD-10-CM | POA: Diagnosis not present

## 2016-03-02 DIAGNOSIS — E039 Hypothyroidism, unspecified: Secondary | ICD-10-CM | POA: Diagnosis not present

## 2016-03-04 DIAGNOSIS — Z9181 History of falling: Secondary | ICD-10-CM | POA: Diagnosis not present

## 2016-03-04 DIAGNOSIS — M6281 Muscle weakness (generalized): Secondary | ICD-10-CM | POA: Diagnosis not present

## 2016-03-04 DIAGNOSIS — E039 Hypothyroidism, unspecified: Secondary | ICD-10-CM | POA: Diagnosis not present

## 2016-03-04 DIAGNOSIS — F0390 Unspecified dementia without behavioral disturbance: Secondary | ICD-10-CM | POA: Diagnosis not present

## 2016-03-05 DIAGNOSIS — E039 Hypothyroidism, unspecified: Secondary | ICD-10-CM | POA: Diagnosis not present

## 2016-03-05 DIAGNOSIS — M6281 Muscle weakness (generalized): Secondary | ICD-10-CM | POA: Diagnosis not present

## 2016-03-05 DIAGNOSIS — Z9181 History of falling: Secondary | ICD-10-CM | POA: Diagnosis not present

## 2016-03-05 DIAGNOSIS — F0390 Unspecified dementia without behavioral disturbance: Secondary | ICD-10-CM | POA: Diagnosis not present

## 2016-03-06 DIAGNOSIS — M6281 Muscle weakness (generalized): Secondary | ICD-10-CM | POA: Diagnosis not present

## 2016-03-06 DIAGNOSIS — F0390 Unspecified dementia without behavioral disturbance: Secondary | ICD-10-CM | POA: Diagnosis not present

## 2016-03-06 DIAGNOSIS — Z9181 History of falling: Secondary | ICD-10-CM | POA: Diagnosis not present

## 2016-03-06 DIAGNOSIS — E039 Hypothyroidism, unspecified: Secondary | ICD-10-CM | POA: Diagnosis not present

## 2016-03-10 DIAGNOSIS — Z9181 History of falling: Secondary | ICD-10-CM | POA: Diagnosis not present

## 2016-03-10 DIAGNOSIS — M6281 Muscle weakness (generalized): Secondary | ICD-10-CM | POA: Diagnosis not present

## 2016-03-10 DIAGNOSIS — E039 Hypothyroidism, unspecified: Secondary | ICD-10-CM | POA: Diagnosis not present

## 2016-03-10 DIAGNOSIS — F0390 Unspecified dementia without behavioral disturbance: Secondary | ICD-10-CM | POA: Diagnosis not present

## 2016-03-12 DIAGNOSIS — Z9181 History of falling: Secondary | ICD-10-CM | POA: Diagnosis not present

## 2016-03-12 DIAGNOSIS — M6281 Muscle weakness (generalized): Secondary | ICD-10-CM | POA: Diagnosis not present

## 2016-03-12 DIAGNOSIS — H527 Unspecified disorder of refraction: Secondary | ICD-10-CM | POA: Diagnosis not present

## 2016-03-12 DIAGNOSIS — E039 Hypothyroidism, unspecified: Secondary | ICD-10-CM | POA: Diagnosis not present

## 2016-03-12 DIAGNOSIS — F0390 Unspecified dementia without behavioral disturbance: Secondary | ICD-10-CM | POA: Diagnosis not present

## 2016-03-12 DIAGNOSIS — H401131 Primary open-angle glaucoma, bilateral, mild stage: Secondary | ICD-10-CM | POA: Diagnosis not present

## 2016-03-13 DIAGNOSIS — M6281 Muscle weakness (generalized): Secondary | ICD-10-CM | POA: Diagnosis not present

## 2016-03-13 DIAGNOSIS — F0390 Unspecified dementia without behavioral disturbance: Secondary | ICD-10-CM | POA: Diagnosis not present

## 2016-03-13 DIAGNOSIS — Z9181 History of falling: Secondary | ICD-10-CM | POA: Diagnosis not present

## 2016-03-13 DIAGNOSIS — E039 Hypothyroidism, unspecified: Secondary | ICD-10-CM | POA: Diagnosis not present

## 2016-03-17 DIAGNOSIS — M6281 Muscle weakness (generalized): Secondary | ICD-10-CM | POA: Diagnosis not present

## 2016-03-17 DIAGNOSIS — F0390 Unspecified dementia without behavioral disturbance: Secondary | ICD-10-CM | POA: Diagnosis not present

## 2016-03-17 DIAGNOSIS — E039 Hypothyroidism, unspecified: Secondary | ICD-10-CM | POA: Diagnosis not present

## 2016-03-17 DIAGNOSIS — Z9181 History of falling: Secondary | ICD-10-CM | POA: Diagnosis not present

## 2016-03-18 DIAGNOSIS — M6281 Muscle weakness (generalized): Secondary | ICD-10-CM | POA: Diagnosis not present

## 2016-03-18 DIAGNOSIS — E039 Hypothyroidism, unspecified: Secondary | ICD-10-CM | POA: Diagnosis not present

## 2016-03-18 DIAGNOSIS — Z9181 History of falling: Secondary | ICD-10-CM | POA: Diagnosis not present

## 2016-03-18 DIAGNOSIS — F0390 Unspecified dementia without behavioral disturbance: Secondary | ICD-10-CM | POA: Diagnosis not present

## 2016-03-25 DIAGNOSIS — M6281 Muscle weakness (generalized): Secondary | ICD-10-CM | POA: Diagnosis not present

## 2016-03-25 DIAGNOSIS — E039 Hypothyroidism, unspecified: Secondary | ICD-10-CM | POA: Diagnosis not present

## 2016-03-25 DIAGNOSIS — F0390 Unspecified dementia without behavioral disturbance: Secondary | ICD-10-CM | POA: Diagnosis not present

## 2016-03-25 DIAGNOSIS — Z9181 History of falling: Secondary | ICD-10-CM | POA: Diagnosis not present

## 2016-04-06 DIAGNOSIS — E039 Hypothyroidism, unspecified: Secondary | ICD-10-CM | POA: Diagnosis not present

## 2016-04-07 LAB — TSH: TSH: 3.05 u[IU]/mL (ref 0.450–4.500)

## 2016-04-09 DIAGNOSIS — M6281 Muscle weakness (generalized): Secondary | ICD-10-CM | POA: Diagnosis not present

## 2016-04-09 DIAGNOSIS — Z9181 History of falling: Secondary | ICD-10-CM | POA: Diagnosis not present

## 2016-04-09 DIAGNOSIS — F0390 Unspecified dementia without behavioral disturbance: Secondary | ICD-10-CM | POA: Diagnosis not present

## 2016-04-09 DIAGNOSIS — E039 Hypothyroidism, unspecified: Secondary | ICD-10-CM | POA: Diagnosis not present

## 2016-04-13 ENCOUNTER — Encounter: Payer: Self-pay | Admitting: Family Medicine

## 2016-04-13 ENCOUNTER — Ambulatory Visit (INDEPENDENT_AMBULATORY_CARE_PROVIDER_SITE_OTHER): Payer: Medicare Other | Admitting: Family Medicine

## 2016-04-13 VITALS — BP 114/62 | Ht 67.0 in | Wt 127.0 lb

## 2016-04-13 DIAGNOSIS — E039 Hypothyroidism, unspecified: Secondary | ICD-10-CM

## 2016-04-13 NOTE — Progress Notes (Signed)
   Subjective:    Patient ID: Tammy Mosley, female    DOB: Oct 14, 1928, 83 y.o.   MRN: [id number]  HPI pt arrives with daughter in [employer].  Follow up on hyprothyroidism.  No concerns or problems today.  Family working hard to ensure cmliancewith mediction. No symptoms of high or low thyroid.according to family patient's mentation seems to have improved.  Review of Systems No headache, no major weight loss or weight gain, no chest pain no back pain abdominal pain no change in bowel habits complete ROS otherwise negative     Objective:   Physical Exam Alert vitals stable, NAD. Blood pressure good on repeat. HEENT normal. Lungs clear. Heart regular rate and rhythm.   Results for orders placed or performed in visit on 02/28/16  TSH  Result Value Ref Range   TSH 3.050 0.450 - 4.500 uIU/mL        Assessment & Plan:   impression hypothyroidism complicated also by dementia and noncompliance. Now we appear to finally got in good compliance and TSH is excellent plan maintain same meds follow-up a schedule warning signs discussed

## 2016-04-20 DIAGNOSIS — Z9181 History of falling: Secondary | ICD-10-CM | POA: Diagnosis not present

## 2016-04-20 DIAGNOSIS — M6281 Muscle weakness (generalized): Secondary | ICD-10-CM | POA: Diagnosis not present

## 2016-04-20 DIAGNOSIS — E039 Hypothyroidism, unspecified: Secondary | ICD-10-CM | POA: Diagnosis not present

## 2016-04-20 DIAGNOSIS — F0390 Unspecified dementia without behavioral disturbance: Secondary | ICD-10-CM | POA: Diagnosis not present

## 2016-07-27 DIAGNOSIS — L308 Other specified dermatitis: Secondary | ICD-10-CM | POA: Diagnosis not present

## 2016-09-17 DIAGNOSIS — H401131 Primary open-angle glaucoma, bilateral, mild stage: Secondary | ICD-10-CM | POA: Diagnosis not present

## 2016-09-21 ENCOUNTER — Other Ambulatory Visit: Payer: Self-pay | Admitting: Family Medicine

## 2016-09-25 ENCOUNTER — Other Ambulatory Visit: Payer: Self-pay | Admitting: Family Medicine

## 2016-10-12 ENCOUNTER — Ambulatory Visit: Payer: Medicare Other | Admitting: Family Medicine

## 2016-10-23 ENCOUNTER — Encounter: Payer: Self-pay | Admitting: Family Medicine

## 2016-10-23 ENCOUNTER — Ambulatory Visit (INDEPENDENT_AMBULATORY_CARE_PROVIDER_SITE_OTHER): Payer: Medicare Other | Admitting: Family Medicine

## 2016-10-23 VITALS — BP 120/74 | Ht 67.0 in | Wt 147.0 lb

## 2016-10-23 DIAGNOSIS — E038 Other specified hypothyroidism: Secondary | ICD-10-CM

## 2016-10-23 DIAGNOSIS — G301 Alzheimer's disease with late onset: Secondary | ICD-10-CM

## 2016-10-23 DIAGNOSIS — F0280 Dementia in other diseases classified elsewhere without behavioral disturbance: Secondary | ICD-10-CM | POA: Diagnosis not present

## 2016-10-23 DIAGNOSIS — R5383 Other fatigue: Secondary | ICD-10-CM

## 2016-10-23 MED ORDER — LEVOTHYROXINE SODIUM 100 MCG PO TABS: ORAL_TABLET | ORAL | 5 refills | Status: DC

## 2016-10-23 NOTE — Progress Notes (Signed)
   Subjective:    Patient ID: Tammy Mosley, female    DOB: 1929-02-14, 83 y.o.   MRN: [id number]  HPI Patient is here today with her Dtr inlaw Mardene Celeste, to follow up on her Hypothyroidism. She is currently taking Levothyroxine 100 mg one daily. Eats healthy ,but forgets to eat at times. Walks some for exercise. No other concerns.  catartacts and glaucoma,patient struggling with this significantly. Next  Othe family members hav started helping out more. Watching closely. Helping with compliance with medication. Helping with food and shopping. Patient no longer drives    quite concerned about husband. He has serious foot infection, in fact amputation has been recommended and he is refused that she is concerned that he is  Going to have serious difficultie    family reports patient continues to have substantial issues with short-term memory loss. Patient has been diagnosed with dementia. Has declined medication for this  Review of Systems No headache, no major weight loss or weight gain, no chest pain no back pain abdominal pain no change in bowel habits complete ROS otherwise negative     Objective:   Physical Exam  Alert and oriented, vitals reviewed and stable, NAD ENT-TM's and ext canals WNL bilat via otoscopic exam Soft palate, tonsils and post pharynx WNL via oropharyngeal exam Neck-symmetric, no masses; thyroid nonpalpable and nontender Pulmonary-no tachypnea or accessory muscle use; Clear without wheezes via auscultation Card--no abnrml murmurs, rhythm reg and rate WNL Carotid pulses symmetric, without bruits  thyroid not palpable      Assessment & Plan:   impression 1 hypothyroidism discussed. Current status uncertain will check blood work #2 dementia Alzheimer's in nature slowly progressing.Family reports they have been adapted to this at home for her. #3 family concerns patient reports substantial worry about her husband. Claims no personal depression in this regard.Diet  discussed. Exercise discussed. Importance of compliance with medicine discussed. Further recommendations based on blood work.follow-up in 6 months.  Greater than 50% of this 25 minute face to face visit was spent in counseling and discussion and coordination of care regarding the above diagnosis/diagnosies

## 2016-10-24 LAB — TSH: TSH: 0.298 u[IU]/mL — AB (ref 0.450–4.500)

## 2016-10-29 MED ORDER — LEVOTHYROXINE SODIUM 88 MCG PO TABS: 88.0000 ug | ORAL_TABLET | Freq: Every day | ORAL | 5 refills | Status: DC

## 2016-10-29 NOTE — Addendum Note (Signed)
Addended by: Karle Barr on: 10/29/2016 08:43 AM   Modules accepted: Orders

## 2016-10-29 NOTE — Addendum Note (Signed)
Addended by: Karle Barr on: 10/29/2016 08:40 AM   Modules accepted: Orders

## 2016-12-31 ENCOUNTER — Telehealth: Payer: Self-pay | Admitting: Family Medicine

## 2016-12-31 NOTE — Telephone Encounter (Signed)
Spoke with pharmacy and pharmacy stated since patient switched pharmacy and they use a different manufacturer in order to keep patient in a therapeutic range they  have to ask if it is ok to switch different manufacturers. (never heard any other pharmacy do this but [facility] stated that they are required to ask when patient switches and manufacturers are different.

## 2016-12-31 NOTE — Telephone Encounter (Signed)
Patient is on levothyroxine and has recently switched pharmacies.  Tammy Mosley wants to know if it is OK to switch her medication to a different manufacturer before it is filled.  She has only 1 pill left so she is hoping this can be done ASAP.

## 2016-12-31 NOTE — Telephone Encounter (Signed)
I spoke to Tammy Mosley at [facility] he is aware yes it is ok.

## 2016-12-31 NOTE — Telephone Encounter (Signed)
Yes ok (and what if we said it wasn't?!)

## 2017-01-18 DIAGNOSIS — H401133 Primary open-angle glaucoma, bilateral, severe stage: Secondary | ICD-10-CM | POA: Diagnosis not present

## 2017-03-15 DIAGNOSIS — H15011 Anterior scleritis, right eye: Secondary | ICD-10-CM | POA: Diagnosis not present

## 2017-04-12 DIAGNOSIS — H15011 Anterior scleritis, right eye: Secondary | ICD-10-CM | POA: Diagnosis not present

## 2017-04-26 ENCOUNTER — Ambulatory Visit: Payer: Medicare Other | Admitting: Family Medicine

## 2017-05-06 ENCOUNTER — Ambulatory Visit (INDEPENDENT_AMBULATORY_CARE_PROVIDER_SITE_OTHER): Payer: Medicare Other | Admitting: Family Medicine

## 2017-05-06 ENCOUNTER — Encounter: Payer: Self-pay | Admitting: Family Medicine

## 2017-05-06 VITALS — BP 138/62 | Temp 97.9°F | Ht 67.0 in | Wt 154.0 lb

## 2017-05-06 DIAGNOSIS — Z13220 Encounter for screening for lipoid disorders: Secondary | ICD-10-CM

## 2017-05-06 DIAGNOSIS — Z23 Encounter for immunization: Secondary | ICD-10-CM

## 2017-05-06 DIAGNOSIS — E039 Hypothyroidism, unspecified: Secondary | ICD-10-CM | POA: Diagnosis not present

## 2017-05-06 DIAGNOSIS — F0280 Dementia in other diseases classified elsewhere without behavioral disturbance: Secondary | ICD-10-CM

## 2017-05-06 DIAGNOSIS — G301 Alzheimer's disease with late onset: Secondary | ICD-10-CM | POA: Diagnosis not present

## 2017-05-06 DIAGNOSIS — R21 Rash and other nonspecific skin eruption: Secondary | ICD-10-CM | POA: Diagnosis not present

## 2017-05-06 DIAGNOSIS — L89312 Pressure ulcer of right buttock, stage 2: Secondary | ICD-10-CM

## 2017-05-06 DIAGNOSIS — Z79899 Other long term (current) drug therapy: Secondary | ICD-10-CM | POA: Diagnosis not present

## 2017-05-06 MED ORDER — LEVOTHYROXINE SODIUM 88 MCG PO TABS: 88.0000 ug | ORAL_TABLET | Freq: Every day | ORAL | 5 refills | Status: DC

## 2017-05-06 NOTE — Progress Notes (Signed)
   Subjective:    Patient ID: Tammy Mosley, female    DOB: 12-26-1928, 84 y.o.   MRN: [id number]  HPI Patient is here today to follow up on Hypothyroidism. She is currently taking Levothyroxine 88 mcg daily. She eats healthy, and does not get exercise. She sees an [facility] [place]). Pt has not had any fall nor MV accidents as of late.She has a sore on her bottom she would like for you to look at.   Tends to sit all day, has developed a sore on the rump, uses wheelchair all day.  Walking with a walker now.  Complaining of pain.  Of the skin ulcers/irritation  Uses a walker ostly these days,  Good appetite with eating, tyring to eat soe every day+    Patient has known dementia.  Slowly worsening.  Did not respond to trial medications in the past.  Now likely worsened due to family stress husband is in the nursing home with serious difficulties        +  Review of Systems No headache, no major weight loss or weight gain, no chest pain no back pain abdominal pain no change in bowel habits complete ROS otherwise negative     Objective:   Physical Exam Alert and not oriented, vitals reviewed and stable, NAD ENT-TM's and ext canals WNL bilat via otoscopic exam Soft palate, tonsils and post pharynx WNL via oropharyngeal exam Neck-symmetric, no masses; thyroid nonpalpable and nontender Pulmonary-no tachypnea or accessory muscle use; Clear without wheezes via auscultation Card--no abnrml murmurs, rhythm reg and rate WNL Carotid pulses symmetric, without bruits Skin ulcer grade 2 small right buttock      Assessment & Plan:  Impression 1 progressive dementia discussed to maintain family supportive care no medications rationale discussed  2.  Grade 2 skin ulcer with slight skin breakdown.  Sits all day will initiate DuoDERM rationale discussed proper use discussed  3.  Hypothyroidism meds adjusted muscular current status uncertain we will check blood work further  recommendations based on results  Recheck in 6 months flu shot today

## 2017-05-07 LAB — BASIC METABOLIC PANEL
BUN / CREAT RATIO: 17 (ref 12–28)
BUN: 20 mg/dL (ref 8–27)
CO2: 25 mmol/L (ref 20–29)
Calcium: 9.4 mg/dL (ref 8.7–10.3)
Chloride: 103 mmol/L (ref 96–106)
Creatinine, Ser: 1.21 mg/dL — ABNORMAL HIGH (ref 0.57–1.00)
GFR calc non Af Amer: 41 mL/min/{1.73_m2} — ABNORMAL LOW (ref >59)
GFR, EST AFRICAN AMERICAN: 47 mL/min/{1.73_m2} — AB (ref >59)
Glucose: 87 mg/dL (ref 65–99)
POTASSIUM: 4.5 mmol/L (ref 3.5–5.2)
Sodium: 141 mmol/L (ref 134–144)

## 2017-05-07 LAB — LIPID PANEL
CHOLESTEROL TOTAL: 249 mg/dL — AB (ref 100–199)
Chol/HDL Ratio: 5.2 ratio — ABNORMAL HIGH (ref 0.0–4.4)
HDL: 48 mg/dL (ref >39)
LDL CALC: 157 mg/dL — AB (ref 0–99)
TRIGLYCERIDES: 222 mg/dL — AB (ref 0–149)
VLDL Cholesterol Cal: 44 mg/dL — ABNORMAL HIGH (ref 5–40)

## 2017-05-07 LAB — HEPATIC FUNCTION PANEL
ALT: 7 IU/L (ref 0–32)
AST: 13 IU/L (ref 0–40)
Albumin: 4.3 g/dL (ref 3.5–4.7)
Alkaline Phosphatase: 95 IU/L (ref 39–117)
BILIRUBIN TOTAL: 0.5 mg/dL (ref 0.0–1.2)
BILIRUBIN, DIRECT: 0.12 mg/dL (ref 0.00–0.40)
Total Protein: 7.1 g/dL (ref 6.0–8.5)

## 2017-05-07 LAB — TSH: TSH: 5.91 u[IU]/mL — ABNORMAL HIGH (ref 0.450–4.500)

## 2017-05-10 ENCOUNTER — Other Ambulatory Visit: Payer: Self-pay

## 2017-05-10 MED ORDER — LEVOTHYROXINE SODIUM 100 MCG PO TABS: 100.0000 ug | ORAL_TABLET | Freq: Every day | ORAL | 1 refills | Status: DC

## 2017-05-10 MED ORDER — LEVOTHYROXINE SODIUM 100 MCG PO TABS: 100.0000 ug | ORAL_TABLET | Freq: Every day | ORAL | 1 refills | Status: AC

## 2017-05-10 NOTE — Addendum Note (Signed)
Addended by: Karle Barr on: 05/10/2017 09:37 AM   Modules accepted: Orders

## 2017-05-31 ENCOUNTER — Encounter (HOSPITAL_COMMUNITY): Payer: Self-pay | Admitting: Emergency Medicine

## 2017-05-31 ENCOUNTER — Ambulatory Visit (INDEPENDENT_AMBULATORY_CARE_PROVIDER_SITE_OTHER): Payer: Medicare Other | Admitting: Family Medicine

## 2017-05-31 ENCOUNTER — Encounter: Payer: Self-pay | Admitting: Family Medicine

## 2017-05-31 ENCOUNTER — Inpatient Hospital Stay (HOSPITAL_COMMUNITY)
Admission: EM | Admit: 2017-05-31 | Discharge: 2017-06-05 | DRG: 175 | Disposition: A | Discharge status: Skilled Nursing Facility | Payer: Medicare Other | Attending: Internal Medicine | Admitting: Internal Medicine

## 2017-05-31 ENCOUNTER — Emergency Department (HOSPITAL_COMMUNITY): Payer: Medicare Other

## 2017-05-31 VITALS — BP 102/66 | HR 44 | Temp 94.4°F | Ht 67.0 in | Wt 154.0 lb

## 2017-05-31 DIAGNOSIS — R339 Retention of urine, unspecified: Secondary | ICD-10-CM | POA: Diagnosis present

## 2017-05-31 DIAGNOSIS — I2699 Other pulmonary embolism without acute cor pulmonale: Secondary | ICD-10-CM | POA: Diagnosis not present

## 2017-05-31 DIAGNOSIS — J111 Influenza due to unidentified influenza virus with other respiratory manifestations: Secondary | ICD-10-CM

## 2017-05-31 DIAGNOSIS — I1310 Hypertensive heart and chronic kidney disease without heart failure, with stage 1 through stage 4 chronic kidney disease, or unspecified chronic kidney disease: Secondary | ICD-10-CM | POA: Diagnosis present

## 2017-05-31 DIAGNOSIS — J9601 Acute respiratory failure with hypoxia: Secondary | ICD-10-CM | POA: Diagnosis not present

## 2017-05-31 DIAGNOSIS — R000 Tachycardia, unspecified: Secondary | ICD-10-CM | POA: Diagnosis present

## 2017-05-31 DIAGNOSIS — F0280 Dementia in other diseases classified elsewhere without behavioral disturbance: Secondary | ICD-10-CM | POA: Diagnosis not present

## 2017-05-31 DIAGNOSIS — R4182 Altered mental status, unspecified: Secondary | ICD-10-CM | POA: Diagnosis not present

## 2017-05-31 DIAGNOSIS — I248 Other forms of acute ischemic heart disease: Secondary | ICD-10-CM | POA: Diagnosis present

## 2017-05-31 DIAGNOSIS — F0390 Unspecified dementia without behavioral disturbance: Secondary | ICD-10-CM | POA: Diagnosis present

## 2017-05-31 DIAGNOSIS — Z8543 Personal history of malignant neoplasm of ovary: Secondary | ICD-10-CM | POA: Diagnosis not present

## 2017-05-31 DIAGNOSIS — R0902 Hypoxemia: Secondary | ICD-10-CM

## 2017-05-31 DIAGNOSIS — M6281 Muscle weakness (generalized): Secondary | ICD-10-CM | POA: Diagnosis not present

## 2017-05-31 DIAGNOSIS — R34 Anuria and oliguria: Secondary | ICD-10-CM | POA: Diagnosis present

## 2017-05-31 DIAGNOSIS — J45909 Unspecified asthma, uncomplicated: Secondary | ICD-10-CM | POA: Diagnosis present

## 2017-05-31 DIAGNOSIS — H409 Unspecified glaucoma: Secondary | ICD-10-CM | POA: Diagnosis present

## 2017-05-31 DIAGNOSIS — E039 Hypothyroidism, unspecified: Secondary | ICD-10-CM | POA: Diagnosis present

## 2017-05-31 DIAGNOSIS — F0281 Dementia in other diseases classified elsewhere with behavioral disturbance: Secondary | ICD-10-CM | POA: Diagnosis not present

## 2017-05-31 DIAGNOSIS — R739 Hyperglycemia, unspecified: Secondary | ICD-10-CM | POA: Diagnosis present

## 2017-05-31 DIAGNOSIS — G301 Alzheimer's disease with late onset: Secondary | ICD-10-CM

## 2017-05-31 DIAGNOSIS — R05 Cough: Secondary | ICD-10-CM | POA: Diagnosis not present

## 2017-05-31 DIAGNOSIS — N183 Chronic kidney disease, stage 3 (moderate): Secondary | ICD-10-CM | POA: Diagnosis present

## 2017-05-31 DIAGNOSIS — R1312 Dysphagia, oropharyngeal phase: Secondary | ICD-10-CM | POA: Diagnosis not present

## 2017-05-31 DIAGNOSIS — D696 Thrombocytopenia, unspecified: Secondary | ICD-10-CM | POA: Diagnosis present

## 2017-05-31 DIAGNOSIS — R5383 Other fatigue: Secondary | ICD-10-CM | POA: Diagnosis not present

## 2017-05-31 DIAGNOSIS — I351 Nonrheumatic aortic (valve) insufficiency: Secondary | ICD-10-CM | POA: Diagnosis not present

## 2017-05-31 DIAGNOSIS — Z801 Family history of malignant neoplasm of trachea, bronchus and lung: Secondary | ICD-10-CM

## 2017-05-31 DIAGNOSIS — Z66 Do not resuscitate: Secondary | ICD-10-CM | POA: Diagnosis present

## 2017-05-31 DIAGNOSIS — Z825 Family history of asthma and other chronic lower respiratory diseases: Secondary | ICD-10-CM

## 2017-05-31 DIAGNOSIS — J101 Influenza due to other identified influenza virus with other respiratory manifestations: Secondary | ICD-10-CM | POA: Diagnosis not present

## 2017-05-31 DIAGNOSIS — J209 Acute bronchitis, unspecified: Secondary | ICD-10-CM | POA: Diagnosis present

## 2017-05-31 DIAGNOSIS — R0600 Dyspnea, unspecified: Secondary | ICD-10-CM

## 2017-05-31 DIAGNOSIS — R0989 Other specified symptoms and signs involving the circulatory and respiratory systems: Secondary | ICD-10-CM | POA: Diagnosis not present

## 2017-05-31 DIAGNOSIS — R2689 Other abnormalities of gait and mobility: Secondary | ICD-10-CM | POA: Diagnosis not present

## 2017-05-31 DIAGNOSIS — I129 Hypertensive chronic kidney disease with stage 1 through stage 4 chronic kidney disease, or unspecified chronic kidney disease: Secondary | ICD-10-CM | POA: Diagnosis not present

## 2017-05-31 DIAGNOSIS — Z803 Family history of malignant neoplasm of breast: Secondary | ICD-10-CM

## 2017-05-31 DIAGNOSIS — G309 Alzheimer's disease, unspecified: Secondary | ICD-10-CM | POA: Diagnosis not present

## 2017-05-31 DIAGNOSIS — Z87891 Personal history of nicotine dependence: Secondary | ICD-10-CM | POA: Diagnosis not present

## 2017-05-31 DIAGNOSIS — I2692 Saddle embolus of pulmonary artery without acute cor pulmonale: Secondary | ICD-10-CM

## 2017-05-31 DIAGNOSIS — R41841 Cognitive communication deficit: Secondary | ICD-10-CM | POA: Diagnosis not present

## 2017-05-31 DIAGNOSIS — J45998 Other asthma: Secondary | ICD-10-CM | POA: Diagnosis not present

## 2017-05-31 DIAGNOSIS — J80 Acute respiratory distress syndrome: Secondary | ICD-10-CM | POA: Diagnosis not present

## 2017-05-31 LAB — ABO/RH: ABO/RH(D): A POS

## 2017-05-31 LAB — HEPATIC FUNCTION PANEL
ALK PHOS: 81 U/L (ref 38–126)
ALT: 23 U/L (ref 14–54)
AST: 38 U/L (ref 15–41)
Albumin: 3.5 g/dL (ref 3.5–5.0)
BILIRUBIN DIRECT: 0.1 mg/dL (ref 0.1–0.5)
BILIRUBIN INDIRECT: 0.8 mg/dL (ref 0.3–0.9)
TOTAL PROTEIN: 7.1 g/dL (ref 6.5–8.1)
Total Bilirubin: 0.9 mg/dL (ref 0.3–1.2)

## 2017-05-31 LAB — CBC WITH DIFFERENTIAL/PLATELET
Basophils Absolute: 0 10*3/uL (ref 0.0–0.1)
Basophils Relative: 0 %
EOS PCT: 1 %
Eosinophils Absolute: 0 10*3/uL (ref 0.0–0.7)
HCT: 44.8 % (ref 36.0–46.0)
Hemoglobin: 14.4 g/dL (ref 12.0–15.0)
LYMPHS ABS: 1 10*3/uL (ref 0.7–4.0)
Lymphocytes Relative: 12 %
MCH: 28.6 pg (ref 26.0–34.0)
MCHC: 32.1 g/dL (ref 30.0–36.0)
MCV: 88.9 fL (ref 78.0–100.0)
MONO ABS: 0.6 10*3/uL (ref 0.1–1.0)
Monocytes Relative: 7 %
Neutro Abs: 6.7 10*3/uL (ref 1.7–7.7)
Neutrophils Relative %: 80 %
Platelets: 155 10*3/uL (ref 150–400)
RBC: 5.04 MIL/uL (ref 3.87–5.11)
RDW: 14.5 % (ref 11.5–15.5)
WBC: 8.3 10*3/uL (ref 4.0–10.5)

## 2017-05-31 LAB — BASIC METABOLIC PANEL
ANION GAP: 12 (ref 5–15)
BUN: 29 mg/dL — ABNORMAL HIGH (ref 6–20)
CHLORIDE: 101 mmol/L (ref 101–111)
CO2: 21 mmol/L — AB (ref 22–32)
Calcium: 8.4 mg/dL — ABNORMAL LOW (ref 8.9–10.3)
Creatinine, Ser: 1.29 mg/dL — ABNORMAL HIGH (ref 0.44–1.00)
GFR calc Af Amer: 42 mL/min — ABNORMAL LOW (ref >60)
GFR calc non Af Amer: 36 mL/min — ABNORMAL LOW (ref >60)
GLUCOSE: 216 mg/dL — AB (ref 65–99)
Potassium: 4.5 mmol/L (ref 3.5–5.1)
Sodium: 134 mmol/L — ABNORMAL LOW (ref 135–145)

## 2017-05-31 LAB — INFLUENZA PANEL BY PCR (TYPE A & B)
INFLBPCR: NEGATIVE
Influenza A By PCR: POSITIVE — AB

## 2017-05-31 LAB — PROTIME-INR
INR: 1.11
Prothrombin Time: 14.2 seconds (ref 11.4–15.2)

## 2017-05-31 LAB — LACTIC ACID, PLASMA
Lactic Acid, Venous: 2.1 mmol/L (ref 0.5–1.9)
Lactic Acid, Venous: 1.4 mmol/L (ref 0.5–1.9)

## 2017-05-31 LAB — IRON AND TIBC
IRON: 30 ug/dL (ref 28–170)
SATURATION RATIOS: 11 % (ref 10.4–31.8)
TIBC: 277 ug/dL (ref 250–450)
UIBC: 247 ug/dL

## 2017-05-31 LAB — FOLATE: Folate: 18.2 ng/mL (ref >5.9)

## 2017-05-31 LAB — VITAMIN B12: Vitamin B-12: 926 pg/mL — ABNORMAL HIGH (ref 180–914)

## 2017-05-31 LAB — POCT HEMOGLOBIN: HEMOGLOBIN: 5.5 g/dL — AB (ref 12.2–16.2)

## 2017-05-31 LAB — BRAIN NATRIURETIC PEPTIDE: B Natriuretic Peptide: 458 pg/mL — ABNORMAL HIGH (ref 0.0–100.0)

## 2017-05-31 LAB — RETICULOCYTES
RBC.: 5.04 MIL/uL (ref 3.87–5.11)
RETIC COUNT ABSOLUTE: 45.4 10*3/uL (ref 19.0–186.0)
Retic Ct Pct: 0.9 % (ref 0.4–3.1)

## 2017-05-31 LAB — PREPARE RBC (CROSSMATCH)

## 2017-05-31 LAB — FERRITIN: FERRITIN: 150 ng/mL (ref 11–307)

## 2017-05-31 LAB — TROPONIN I: Troponin I: 0.44 ng/mL (ref <0.03)

## 2017-05-31 MED ORDER — SODIUM CHLORIDE 0.9 % IV BOLUS (SEPSIS)
1000.0000 mL | Freq: Once | INTRAVENOUS | Status: AC
  Administered 2017-05-31: 1000 mL via INTRAVENOUS

## 2017-05-31 MED ORDER — HEPARIN BOLUS VIA INFUSION
4000.0000 [IU] | Freq: Once | INTRAVENOUS | Status: AC
  Administered 2017-05-31: 4000 [IU] via INTRAVENOUS

## 2017-05-31 MED ORDER — IOPAMIDOL (ISOVUE-370) INJECTION 76%
75.0000 mL | Freq: Once | INTRAVENOUS | Status: AC | PRN
  Administered 2017-05-31: 75 mL via INTRAVENOUS

## 2017-05-31 MED ORDER — HEPARIN (PORCINE) IN NACL 100-0.45 UNIT/ML-% IJ SOLN
800.0000 [IU]/h | INTRAMUSCULAR | Status: AC
Start: 2017-05-31 — End: 2017-06-03
  Administered 2017-05-31 – 2017-06-03 (×2): 800 [IU]/h via INTRAVENOUS
  Filled 2017-05-31 (×3): qty 250

## 2017-05-31 MED ORDER — DORZOLAMIDE HCL 2 % OP SOLN
1.0000 [drp] | Freq: Two times a day (BID) | OPHTHALMIC | Status: DC
  Administered 2017-06-01 – 2017-06-05 (×10): 1 [drp] via OPHTHALMIC
  Filled 2017-05-31: qty 10

## 2017-05-31 MED ORDER — SODIUM CHLORIDE 0.9 % IV SOLN: Freq: Once | INTRAVENOUS | Status: DC

## 2017-05-31 MED ORDER — ASPIRIN 81 MG PO CHEW
324.0000 mg | CHEWABLE_TABLET | Freq: Once | ORAL | Status: AC
  Administered 2017-05-31: 324 mg via ORAL
  Filled 2017-05-31: qty 4

## 2017-05-31 NOTE — Progress Notes (Signed)
   Subjective:    Patient ID: Tammy Mosley, female    DOB: 07-29-1928, 84 y.o.   MRN: [id number]  HPI Patient is here today with her daughter and they state she has been short of breath upon exertion.  Patient presents with active malaise.  Not acting right per family.  Appears to be short of breath.  Diminished energy.  No history of passing blood.  Patient claims no chest pain per se.  She does note some shortness of breath with any type of exertion.  History compromised by patient's history of dementia    Results for orders placed or performed in visit on 05/31/17  POCT hemoglobin  Result Value Ref Range   Hemoglobin 5.5 (A) 12.2 - 16.2 g/dL   Has not signs of any blood loss per family. Review of Systems No headache, no major weight loss or weight gain, no chest pain no back pain abdominal pain no change in bowel habits complete ROS otherwise negative     Objective:   Physical Exam  Baseline not alert and oriented, vitals reviewed and stable, moderate malaise ENT-TM's and ext canals WNL bilat via otoscopic exam Soft palate, tonsils and post pharynx WNL via oropharyngeal exam Neck-symmetric, no masses; thyroid nonpalpable and nontender Pulmonary-no tachypnea or accessory muscle use; Clear without wheezes via auscultation Card--no abnrml murmurs, rhythm reg and rate WNL Carotid pulses symmetric, without bruits       Assessment & Plan:  Impression acute dyspnea/fatigue.  Recent influenza and multiple family members.  Hemoglobin very low.  Discussed with family.  Need for urgent evaluation discussed.  Emergency room called.  Spoke with ER physician.  Patient transported there for urgent evaluation.

## 2017-05-31 NOTE — Progress Notes (Signed)
ANTICOAGULATION CONSULT NOTE - Initial Consult  Pharmacy Consult for Heparin Indication: chest pain/ACS  No Known Allergies  Patient Measurements: Height: 5\' 7"  (170.2 cm) Weight: 154 lb (69.9 kg) IBW/kg (Calculated) : 61.6 HEPARIN DW (KG): 69.9   Vital Signs: Temp: 97.7 F (36.5 C) (03/18 1522) Temp Source: Temporal (03/18 1522) BP: 128/85 (03/18 1700) Pulse Rate: 94 (03/18 1700)  Labs: Recent Labs    05/31/17 1445 05/31/17 1547  HGB 5.5* 14.4  HCT  --  44.8  PLT  --  155  LABPROT  --  14.2  INR  --  1.11  CREATININE  --  1.29*  TROPONINI  --  0.44*   Estimated Creatinine Clearance: 30.4 mL/min (A) (by C-G formula based on SCr of 1.29 mg/dL (H)).  Medical History: Past Medical History:  Diagnosis Date  . Asthma   . Cataracts, bilateral   . Constipation   . Frequent PVCs   . Glaucoma   . Hypothyroidism   . Insomnia   . Ovarian cancer ([place]) 1999  . S/P colonoscopy 2004   Dr. Lizbeth Bark, [facility], diverticulosis and hemorrhoids   Medications:   (Not in a hospital admission)  Meds reviewed, not on an anticoagulant PTA  Assessment: Okay for Protocol.  Elevated troponin noted.  Hg 14.4 on admission. Hg reported as 5.5 at MD office earlier today but this appears to be erroneous.  No bleeding noted per family.  Goal of Therapy:  Heparin level 0.3-0.7 units/ml Monitor platelets by anticoagulation protocol: Yes   Plan:  Give 4000 units bolus x 1 Start heparin infusion at 800 units/hr Check anti-Xa level in 8 hours and daily while on heparin Continue to monitor H&H and platelets  Pricilla Larsson 05/31/2017,5:41 PM

## 2017-05-31 NOTE — ED Triage Notes (Signed)
Pt's daughter in law brought pt in from Dr. Lance Sell office for Hgb 5.5.  Pt has not been feeling well and has been very weak for the past few days.  No obvious blood in stool per daughter in law.  Pt has dementia and does not answer.

## 2017-05-31 NOTE — ED Notes (Signed)
Date and time results received: 05/31/17 4:45 PM   Test: Lactic Acid Critical Value: 2.1  Name of Provider Notified: [facility]  Orders Received? Or Actions Taken?: No new orders at this time.

## 2017-05-31 NOTE — ED Notes (Signed)
Date and time results received: 05/31/17 5:23 PM   Test: troponin Critical Value: 0.44  Name of Provider Notified: Dr. Dayna Barker  Orders Received? Or Actions Taken?: None at this time

## 2017-05-31 NOTE — ED Notes (Signed)
Pt bed linens, pads changed and pure wick readjusted. Pt comfortable and resting at this time.

## 2017-05-31 NOTE — ED Provider Notes (Signed)
Emergency Department Provider Note   I have reviewed the triage vital signs and the nursing notes.   HISTORY  Chief Complaint Abnormal Lab   HPI Tammy Mosley is a 84 y.o. female who is not a good historian so most of the history is given by her son and daughter in law.  Sounds like she had an acute onset of dyspnea worse with exertion that started yesterday.  No real other symptoms except for just being weak and tired and sleeping a lot.  She went to her primary doctor today where they did a hemoglobin and it was 5.5 so she was sent here for further evaluation.  On arrival here patient is alert and answering questions but not making sense consistent with her Alzheimer's.  No recent fevers or cough.  No urinary symptoms.  No GI symptoms.  No chest pain.  No rashes.  No vision changes. No other associated or modifying symptoms.    Past Medical History:  Diagnosis Date  . Asthma   . Cataracts, bilateral   . Constipation   . Frequent PVCs   . Glaucoma   . Hypothyroidism   . Insomnia   . Ovarian cancer ([place]) 1999  . S/P colonoscopy 2004   Dr. Lizbeth Bark, [facility], diverticulosis and hemorrhoids    Patient Active Problem List   Diagnosis Date Noted  . Pulmonary embolus ([place]) 05/31/2017  . Metabolic encephalopathy 32/02/2481  . Hypokalemia 02/21/2016  . Alzheimer's disease 10/11/2013  . Colles' fracture of right radius 02/02/2013  . Lump or mass in breast 01/05/2013  . Short-term memory loss 12/20/2012  . Asthma with acute exacerbation 07/15/2012  . Hypothyroidism 06/20/2012  . Other and unspecified hyperlipidemia 06/20/2012  . Constipation 06/22/2010  . RECTAL BLEEDING 08/23/2009  . COLONIC POLYPS, HX OF 08/23/2009    Past Surgical History:  Procedure Laterality Date  . BREAST SURGERY     calcification removal, L breast  . EYE SURGERY    . HERNIA REPAIR     X 2  . THYROID SURGERY    . TOE SURGERY     toes on both feet straightened  . TOTAL VAGINAL HYSTERECTOMY     due to ovarian cancer    Current Outpatient Rx  . Order #: [id number] Class: Historical Med  . Order #: [id number] Class: Normal  . Order #: [id number] Class: Normal    Allergies Patient has no known allergies.  Family History  Problem Relation Age of Onset  . Lung cancer Father 36       deceased  . Emphysema Mother 62       deceased  . Breast cancer Sister        living   . Colon cancer Neg Hx     Social History Social History   Tobacco Use  . Smoking status: Former Research scientist (life sciences)  . Smokeless tobacco: Never Used  Substance Use Topics  . Alcohol use: No  . Drug use: No    Review of Systems  All other systems negative except as documented in the HPI. All pertinent positives and negatives as reviewed in the HPI. ____________________________________________   PHYSICAL EXAM:  VITAL SIGNS: ED Triage Vitals  Enc Vitals Group     BP 05/31/17 1522 (!) 79/43     Pulse Rate 05/31/17 1522 (!) 109     Resp 05/31/17 1522 18     Temp 05/31/17 1522 97.7 F (36.5 C)     Temp Source 05/31/17 1522 Temporal     SpO2 05/31/17  1522 (!) 87 %     Weight 05/31/17 1521 154 lb (69.9 kg)     Height 05/31/17 1521 5\' 7"  (1.702 m)    Constitutional: Alert disoriented per her dementia. Well appearing and in no acute distress. Eyes: Conjunctivae are normal. PERRL. EOMI. Head: Atraumatic. Nose: No congestion/rhinnorhea. Mouth/Throat: Mucous membranes are moist.  Oropharynx non-erythematous. Neck: No stridor.  No meningeal signs.   Cardiovascular: Tachycardic rate, regular rhythm. Good peripheral circulation. Grossly normal heart sounds.   Respiratory: Mild respiratory distress with tachypneic respiratory effort, hypoxia and difficulty speaking in full sentences.  Mild retractions. Gastrointestinal: Soft and nontender. No distention.  Musculoskeletal: No lower extremity tenderness nor edema. No gross deformities of extremities. Neurologic:  Normal speech and language. No gross focal neurologic  deficits are appreciated.  Skin:  Skin is warm, dry and intact. No rash noted.   ____________________________________________   LABS (all labs ordered are listed, but only abnormal results are displayed)  Labs Reviewed  BASIC METABOLIC PANEL - Abnormal; Notable for the following components:      Result Value   Sodium 134 (*)    CO2 21 (*)    Glucose, Bld 216 (*)    BUN 29 (*)    Creatinine, Ser 1.29 (*)    Calcium 8.4 (*)    GFR calc non Af Amer 36 (*)    GFR calc Af Amer 42 (*)    All other components within normal limits  VITAMIN B12 - Abnormal; Notable for the following components:   Vitamin B-12 926 (*)    All other components within normal limits  LACTIC ACID, PLASMA - Abnormal; Notable for the following components:   Lactic Acid, Venous 2.1 (*)    All other components within normal limits  INFLUENZA PANEL BY PCR (TYPE A & B) - Abnormal; Notable for the following components:   Influenza A By PCR POSITIVE (*)    All other components within normal limits  TROPONIN I - Abnormal; Notable for the following components:   Troponin I 0.44 (*)    All other components within normal limits  BRAIN NATRIURETIC PEPTIDE - Abnormal; Notable for the following components:   B Natriuretic Peptide 458.0 (*)    All other components within normal limits  CBC WITH DIFFERENTIAL/PLATELET  PROTIME-INR  HEPATIC FUNCTION PANEL  IRON AND TIBC  FERRITIN  RETICULOCYTES  LACTIC ACID, PLASMA  OCCULT BLOOD X 1 CARD TO LAB, STOOL  FOLATE  URINALYSIS, ROUTINE W REFLEX MICROSCOPIC  HEPARIN LEVEL (UNFRACTIONATED)  CBC  TYPE AND SCREEN  ABO/RH  PREPARE RBC (CROSSMATCH)   ____________________________________________  EKG   EKG Interpretation  Date/Time:  Monday May 31 2017 16:47:30 EDT Ventricular Rate:  100 PR Interval:    QRS Duration: 103 QT Interval:  348 QTC Calculation: 449 R Axis:   48 Text Interpretation:  Sinus tachycardia Borderline repolarization abnormality aside from  sinus tachycardia, new TWI in V2-3, TWI in II, new q waves in III Confirmed by Mae Denunzio, Corene Cornea [phone]) on 05/31/2017 10:35:03 PM       ____________________________________________  RADIOLOGY  Ct Angio Chest Pe W And/or Wo Contrast  Result Date: 05/31/2017 CLINICAL DATA:  Anemia, hemoglobin 5.5 g, weakness, history asthma, former smoker, remote history of ovarian cancer EXAM: CT ANGIOGRAPHY CHEST WITH CONTRAST TECHNIQUE: Multidetector CT imaging of the chest was performed using the standard protocol during bolus administration of intravenous contrast. Multiplanar CT image reconstructions and MIPs were obtained to evaluate the vascular anatomy. CONTRAST:  52mL ISOVUE-370 IOPAMIDOL (  ISOVUE-370) INJECTION 76% IV COMPARISON:  None FINDINGS: Cardiovascular: Atherosclerotic calcifications aorta and coronary arteries. Aorta normal caliber. Pulmonary arteries well opacified. Large filling defects identified in the central pulmonary arteries bilaterally compatible with pulmonary embolism. These include a large saddle emboli at the bifurcations of the LEFT and RIGHT pulmonary arteries as well as a large thrombus within the proximal RIGHT pulmonary artery extending to the main pulmonary artery bifurcation. Emboli extend into all pulmonary lobes bilaterally greatest into RIGHT lower lobe. Dilatation of RIGHT atrium and RIGHT ventricle with an elevated RV/LV ratio = 1.81 consistent with RIGHT heart strain. No pericardial effusion. Mediastinum/Nodes: Esophagus unremarkable. Base of cervical region normal appearance. No thoracic adenopathy. Lungs/Pleura: Minimal atelectasis at LEFT lower lobe. Lungs otherwise clear. Upper Abdomen: Unremarkable Musculoskeletal: Pectus excavatum. No acute osseous findings. Bones demineralized. Review of the MIP images confirms the above findings. IMPRESSION: Multiple pulmonary emboli including saddle emboli at the bifurcations of the RIGHT and LEFT pulmonary arteries, with emboli seen  extending into all lobes. Positive for acute PE with CT evidence of right heart strain (RV/LV Ratio = 1.81) consistent with at least submassive (intermediate risk) PE. The presence of right heart strain has been associated with an increased risk of morbidity and mortality. Please activate Code PE by paging [phone]. Scattered atherosclerotic calcifications of aorta and coronary arteries. Aortic Atherosclerosis (ICD10-I70.0). Critical Value/emergent results were called by telephone at the time of interpretation on 05/31/2017 at 6:11 pm to Dr. Merrily Pew , who verbally acknowledged these results. Electronically Signed   By: Lavonia Dana M.D.   On: 05/31/2017 18:12   Dg Chest Portable 1 View  Result Date: 05/31/2017 CLINICAL DATA:  Hypoxia, weakness, history asthma, former smoker EXAM: PORTABLE CHEST 1 VIEW COMPARISON:  Portable exam 1540 hours compared to 02/21/2016 FINDINGS: Enlargement of cardiac silhouette with pulmonary vascular congestion. Atherosclerotic calcification aorta. Mediastinal contours normal. Lungs clear. No acute infiltrate, pleural effusion or pneumothorax. Osseous demineralization with BILATERAL chronic rotator cuff tears. IMPRESSION: Enlargement of cardiac silhouette with pulmonary vascular congestion. No acute abnormalities. Electronically Signed   By: Lavonia Dana M.D.   On: 05/31/2017 15:58    ____________________________________________   PROCEDURES  Procedure(s) performed:   Procedures  CRITICAL CARE Performed by: Merrily Pew Total critical care time: 42 minutes Critical care time was exclusive of separately billable procedures and treating other patients. Critical care was necessary to treat or prevent imminent or life-threatening deterioration. Critical care was time spent personally by me on the following activities: development of treatment plan with patient and/or surrogate as well as nursing, discussions with consultants, evaluation of patient's response to  treatment, examination of patient, obtaining history from patient or surrogate, ordering and performing treatments and interventions, ordering and review of laboratory studies, ordering and review of radiographic studies, pulse oximetry and re-evaluation of patient's condition.  ____________________________________________   INITIAL IMPRESSION / ASSESSMENT AND PLAN / ED COURSE  Patient had a slightly delayed diagnosis secondary to inaccurate data from her PCP office.  Her hemoglobin repeated was above 14.  This was realized, I reexamined her case and realized that this could very easily be a pulmonary embolus versus coronary artery disease.  Troponin, CT, BMP were all ordered secondary to her respiratory distress and hypoxic respiratory failure.  CT scan was interpreted by myself as having a saddle embolus.  I called radiology discussed with the radiologist they agreed and they also noted that she had right heart strain.  She was very started on heparin because of a positive  troponin.  Is here for massive pulmonary embolus so I contacted critical care.  Delayed ability to speak with them as they were paged multiple times but I did not hear back until over an hour after initial consultation.  They agreed with transfer to [facility] for consideration for TPA after an echocardiogram.  Her heart rate and blood pressure improved with some fluids.  Oxygen improved with some nasal cannula.  Discussed case with her 2 sons as patient has Alzheimer's and cannot make her own decisions.  They both expressed that she would not want to be on life support if she needed it so she was made a DNR.  The one son said that she has a living will and he will retrieve that bring it in.     Pertinent labs & imaging results that were available during my care of the patient were reviewed by me and considered in my medical decision making (see chart for details).  ____________________________________________  FINAL CLINICAL  IMPRESSION(S) / ED DIAGNOSES  Final diagnoses:  Acute saddle pulmonary embolism without acute cor pulmonale (HCC)  Hypoxia  Acute respiratory failure with hypoxia (HCC)     MEDICATIONS GIVEN DURING THIS VISIT:  Medications  0.9 %  sodium chloride infusion ( Intravenous Not Given 05/31/17 1814)  heparin bolus via infusion 4,000 Units (4,000 Units Intravenous Bolus from Bag 05/31/17 1818)    Followed by  heparin ADULT infusion 100 units/mL (25000 units/220mL sodium chloride 0.45%) (800 Units/hr Intravenous New Bag/Given 05/31/17 1819)  dorzolamide (TRUSOPT) 2 % ophthalmic solution 1 drop (not administered)  sodium chloride 0.9 % bolus 1,000 mL (0 mLs Intravenous Stopped 05/31/17 1822)  aspirin chewable tablet 324 mg (324 mg Oral Given 05/31/17 1815)  iopamidol (ISOVUE-370) 76 % injection 75 mL (75 mLs Intravenous Contrast Given 05/31/17 1741)     NEW OUTPATIENT MEDICATIONS STARTED DURING THIS VISIT:  New Prescriptions   No medications on file    Note:  This note was prepared with assistance of Dragon voice recognition software. Occasional wrong-word or sound-a-like substitutions may have occurred due to the inherent limitations of voice recognition software.   Merrily Pew, MD 05/31/17 2238

## 2017-05-31 NOTE — ED Notes (Signed)
Patient off the floor in CT angio study.

## 2017-06-01 ENCOUNTER — Other Ambulatory Visit: Payer: Self-pay

## 2017-06-01 DIAGNOSIS — J101 Influenza due to other identified influenza virus with other respiratory manifestations: Secondary | ICD-10-CM

## 2017-06-01 DIAGNOSIS — I2699 Other pulmonary embolism without acute cor pulmonale: Secondary | ICD-10-CM | POA: Diagnosis present

## 2017-06-01 DIAGNOSIS — R0902 Hypoxemia: Secondary | ICD-10-CM

## 2017-06-01 DIAGNOSIS — I2692 Saddle embolus of pulmonary artery without acute cor pulmonale: Principal | ICD-10-CM

## 2017-06-01 LAB — CBC WITH DIFFERENTIAL/PLATELET
BASOS ABS: 0 10*3/uL (ref 0.0–0.1)
BASOS PCT: 0 %
Eosinophils Absolute: 0 10*3/uL (ref 0.0–0.7)
Eosinophils Relative: 0 %
HEMATOCRIT: 41.2 % (ref 36.0–46.0)
Hemoglobin: 13.9 g/dL (ref 12.0–15.0)
LYMPHS PCT: 13 %
Lymphs Abs: 1.2 10*3/uL (ref 0.7–4.0)
MCH: 29.1 pg (ref 26.0–34.0)
MCHC: 33.7 g/dL (ref 30.0–36.0)
MCV: 86.2 fL (ref 78.0–100.0)
MONO ABS: 0.8 10*3/uL (ref 0.1–1.0)
Monocytes Relative: 8 %
NEUTROS ABS: 7.5 10*3/uL (ref 1.7–7.7)
NEUTROS PCT: 79 %
Platelets: 159 10*3/uL (ref 150–400)
RBC: 4.78 MIL/uL (ref 3.87–5.11)
RDW: 14.4 % (ref 11.5–15.5)
WBC: 9.5 10*3/uL (ref 4.0–10.5)

## 2017-06-01 LAB — URINALYSIS, ROUTINE W REFLEX MICROSCOPIC
Bacteria, UA: NONE SEEN
Bilirubin Urine: NEGATIVE
Glucose, UA: NEGATIVE mg/dL
Ketones, ur: 5 mg/dL — AB
Nitrite: NEGATIVE
PH: 5 (ref 5.0–8.0)
Protein, ur: 30 mg/dL — AB
SPECIFIC GRAVITY, URINE: 1.044 — AB (ref 1.005–1.030)

## 2017-06-01 LAB — CBC
HEMATOCRIT: 41.1 % (ref 36.0–46.0)
HEMOGLOBIN: 13.2 g/dL (ref 12.0–15.0)
MCH: 28.3 pg (ref 26.0–34.0)
MCHC: 32.1 g/dL (ref 30.0–36.0)
MCV: 88 fL (ref 78.0–100.0)
Platelets: 140 10*3/uL — ABNORMAL LOW (ref 150–400)
RBC: 4.67 MIL/uL (ref 3.87–5.11)
RDW: 14.5 % (ref 11.5–15.5)
WBC: 5.3 10*3/uL (ref 4.0–10.5)

## 2017-06-01 LAB — BRAIN NATRIURETIC PEPTIDE: B NATRIURETIC PEPTIDE 5: 674.1 pg/mL — AB (ref 0.0–100.0)

## 2017-06-01 LAB — TYPE AND SCREEN
ABO/RH(D): A POS
ANTIBODY SCREEN: NEGATIVE

## 2017-06-01 LAB — HEPARIN LEVEL (UNFRACTIONATED)
Heparin Unfractionated: 0.61 IU/mL (ref 0.30–0.70)
Heparin Unfractionated: 0.53 IU/mL (ref 0.30–0.70)

## 2017-06-01 LAB — LACTIC ACID, PLASMA: LACTIC ACID, VENOUS: 1.8 mmol/L (ref 0.5–1.9)

## 2017-06-01 LAB — ABO/RH: ABO/RH(D): A POS

## 2017-06-01 LAB — GLUCOSE, CAPILLARY
GLUCOSE-CAPILLARY: 153 mg/dL — AB (ref 65–99)
Glucose-Capillary: 172 mg/dL — ABNORMAL HIGH (ref 65–99)

## 2017-06-01 MED ORDER — DEXMEDETOMIDINE HCL IN NACL 200 MCG/50ML IV SOLN
0.2000 ug/kg/h | INTRAVENOUS | Status: DC
  Administered 2017-06-01 – 2017-06-02 (×2): 0.2 ug/kg/h via INTRAVENOUS
  Filled 2017-06-01: qty 50

## 2017-06-01 MED ORDER — HALOPERIDOL LACTATE 5 MG/ML IJ SOLN
INTRAMUSCULAR | Status: AC
  Administered 2017-06-01: 2 mg via INTRAVENOUS
  Filled 2017-06-01: qty 1

## 2017-06-01 MED ORDER — LORAZEPAM 2 MG/ML IJ SOLN
0.5000 mg | Freq: Once | INTRAMUSCULAR | Status: AC
  Administered 2017-06-01: 0.5 mg via INTRAVENOUS
  Filled 2017-06-01: qty 1

## 2017-06-01 MED ORDER — LORAZEPAM 2 MG/ML IJ SOLN
1.0000 mg | Freq: Once | INTRAMUSCULAR | Status: AC
  Administered 2017-06-01: 1 mg via INTRAVENOUS
  Filled 2017-06-01: qty 1

## 2017-06-01 MED ORDER — SODIUM CHLORIDE 0.9 % IV SOLN: 250.0000 mL | INTRAVENOUS | Status: DC | PRN

## 2017-06-01 MED ORDER — HALOPERIDOL LACTATE 5 MG/ML IJ SOLN
2.0000 mg | Freq: Four times a day (QID) | INTRAMUSCULAR | Status: DC | PRN
  Administered 2017-06-01: 2 mg via INTRAVENOUS

## 2017-06-01 MED ORDER — LEVOTHYROXINE SODIUM 100 MCG PO TABS
100.0000 ug | ORAL_TABLET | Freq: Every day | ORAL | Status: DC
  Administered 2017-06-01 – 2017-06-03 (×3): 100 ug via ORAL
  Filled 2017-06-01: qty 2
  Filled 2017-06-01 (×2): qty 1

## 2017-06-01 MED ORDER — SODIUM CHLORIDE 0.9 % IV SOLN
INTRAVENOUS | Status: DC
  Administered 2017-06-01 – 2017-06-02 (×2): via INTRAVENOUS

## 2017-06-01 MED ORDER — OSELTAMIVIR PHOSPHATE 30 MG PO CAPS
30.0000 mg | ORAL_CAPSULE | Freq: Two times a day (BID) | ORAL | Status: DC
  Administered 2017-06-02 – 2017-06-05 (×7): 30 mg via ORAL
  Filled 2017-06-01 (×7): qty 1

## 2017-06-01 MED ORDER — ORAL CARE MOUTH RINSE
15.0000 mL | Freq: Two times a day (BID) | OROMUCOSAL | Status: DC
  Administered 2017-06-01 – 2017-06-05 (×5): 15 mL via OROMUCOSAL

## 2017-06-01 NOTE — Progress Notes (Signed)
eLink Physician-Brief Progress Note Patient Name: Tammy Mosley DOB: Jul 15, 1928 MRN: [id number]   Date of Service  06/01/2017  HPI/Events of Note  Delirium - History of baseline dementia. Received Haldol 2 mg IV earlier which made her agitation worse.   eICU Interventions  Will order: 1. Precedex IV infusion at low dose 0.2 - 0.5 mcg/kg/hour. Titrate to RASS = 0.     Intervention Category Major Interventions: Delirium, psychosis, severe agitation - evaluation and management  Sommer,Steven Eugene 06/01/2017, 11:26 PM

## 2017-06-01 NOTE — Progress Notes (Signed)
ANTICOAGULATION CONSULT NOTE - Initial Consult  Pharmacy Consult for Heparin Indication: chest pain/ACS  No Known Allergies  Patient Measurements: Height: 5\' 7"  (170.2 cm) Weight: 154 lb (69.9 kg) IBW/kg (Calculated) : 61.6 HEPARIN DW (KG): 69.9   Vital Signs: BP: 146/78 (03/19 0700) Pulse Rate: 74 (03/19 0700)  Labs: Recent Labs    05/31/17 1547 06/01/17 0214  HGB 14.4 13.2  HCT 44.8 41.1  PLT 155 140*  LABPROT 14.2  --   INR 1.11  --   HEPARINUNFRC  --  0.61  CREATININE 1.29*  --   TROPONINI 0.44*  --    Estimated Creatinine Clearance: 30.4 mL/min (A) (by C-G formula based on SCr of 1.29 mg/dL (H)).  Medical History: Past Medical History:  Diagnosis Date  . Asthma   . Cataracts, bilateral   . Constipation   . Frequent PVCs   . Glaucoma   . Hypothyroidism   . Insomnia   . Ovarian cancer ([place]) 1999  . S/P colonoscopy 2004   Dr. Lizbeth Bark, [facility], diverticulosis and hemorrhoids   Medications:   (Not in a hospital admission)  Meds reviewed, not on an anticoagulant PTA  Assessment: Okay for Protocol.  Elevated troponin noted.  Hg 14.4 on admission. Hg reported as 5.5 at MD office earlier today but this appears to be erroneous.  No bleeding noted per family. CT scan was interpreted with saddle embolus. Plan is to transfer to [facility] for consideratio for TPA. Heparin level is therapeutic this AM.  Goal of Therapy:  Heparin level 0.3-0.7 units/ml Monitor platelets by anticoagulation protocol: Yes   Plan:  Continue heparin infusion at 800 units/hr Check anti-Xa level daily while on heparin Continue to monitor H&H and platelets  Isac Sarna, BS Vena [place], BCPS Clinical Pharmacist Pager [phone] 06/01/2017,7:35 AM

## 2017-06-01 NOTE — ED Notes (Signed)
Incontinence care provided. Replaced pure wick at this time

## 2017-06-01 NOTE — Progress Notes (Signed)
ANTICOAGULATION CONSULT NOTE  Pharmacy Consult for Heparin Indication: chest pain/ACS  No Known Allergies  Patient Measurements: Height: 5\' 7"  (170.2 cm) Weight: 151 lb 14.4 oz (68.9 kg) IBW/kg (Calculated) : 61.6 HEPARIN DW (KG): 69.9   Vital Signs: Temp: 99.3 F (37.4 C) (03/19 2356) Temp Source: Axillary (03/19 2356) BP: 163/109 (03/19 2100) Pulse Rate: 111 (03/19 2100)  Labs: Recent Labs    05/31/17 1547 06/01/17 0214 06/01/17 2244  HGB 14.4 13.2 13.9  HCT 44.8 41.1 41.2  PLT 155 140* 159  LABPROT 14.2  --   --   INR 1.11  --   --   HEPARINUNFRC  --  0.61 0.53  CREATININE 1.29*  --   --   TROPONINI 0.44*  --   --    Estimated Creatinine Clearance: 30.4 mL/min (A) (by C-G formula based on SCr of 1.29 mg/dL (H)).   Assessment: 84 y.o. female with PE for heparin  Goal of Therapy:  Heparin level 0.3-0.7 units/ml Monitor platelets by anticoagulation protocol: Yes   Plan:  Continue Heparin at current rate  Phillis Knack, PharmD, BCPS  06/01/2017,11:58 PM

## 2017-06-01 NOTE — Care Management (Signed)
This is a no charge note  Pick up from PCCM per PA, Eddie Dibbles  84 yo lady with PMH of severe dementia (at baseline recognizes family members, not oriented to year, intermittently oriented to place), Asthma, Ovarian cancer s/p TAH/BSO, and CKD, who was admitted by PCCM today due to PE. CTA showed multiple pulmonary emboli including saddle emboli at the bifurcations of the RIGHT and LEFT pulmonary arteries, with emboli seen extending into all lobes. Has CT evidence of R heart straining. IV heparin was started. Will not do thrombolytic treatment per PCCM. We are asked to pick up pt in AM. Currently hemodynamically stable.  Ivor Costa, MD  [facility] Pager [phone]  If 7PM-7AM, please contact night-coverage [email or web address] Password [facility] 06/01/2017, 10:46 PM

## 2017-06-01 NOTE — H&P (Addendum)
Name: Tammy Mosley MRN: [id number] DOB: 09-05-1928    ADMISSION DATE:  05/31/2017 CONSULTATION DATE:  3/19  REFERRING MD :  Dr. Dayna Barker  CHIEF COMPLAINT:  PE  HISTORY OF PRESENT ILLNESS:  84 year old female with PMH as below, which is significant for asthma, ovarian cancer, and hypothyroidism. At baseline she also suffers from severe dementia. She is dependent for most activities of daily living. She can recognize her family members intermittently, but typically does not remember how to get to the restroom or other areas of the house. 3/16 her family noticed that she had been sleeping a lot and was overall just pretty weak. This persisted for two days, which prompted them to present to PCP 3/18. Labs were done at that time, which determined her hemoglobin to be 5.5. She was referred to ED. Repeat hemoglobin in ED was 14, but further workup was done including CT angiogram of the chest. This demonstrated saddle PE with large clot burden and right heart strain. She was started on heparin and transferred to [facility] 3/19 when bed became available for consideration of TPA.   SIGNIFICANT EVENTS    STUDIES:  CTA chest 3/19 > Multiple pulmonary emboli including saddle emboli at the bifurcations of the RIGHT and LEFT pulmonary arteries, with emboli seen extending into all lobes. RV/LV ratio 1.81. Echo 3/20 > Venous dopplers 3/20 >    PAST MEDICAL HISTORY :   has a past medical history of Asthma, Cataracts, bilateral, Constipation, Frequent PVCs, Glaucoma, Hypothyroidism, Insomnia, Ovarian cancer ([place]) (1999), and S/P colonoscopy (2004).  has a past surgical history that includes Thyroid surgery; Total vaginal hysterectomy; Hernia repair; Breast surgery; Toe Surgery; and Eye surgery. Prior to Admission medications   Medication Sig Start Date End Date Taking? Authorizing Provider  dorzolamide (TRUSOPT) 2 % ophthalmic solution Place 1 drop into both eyes 2 (two) times daily.   Yes [provider]  ketoconazole (NIZORAL) 2 % cream Apply 1 application topically 2 (two) times daily. To underarms only 07/05/15  Yes Pearson Forster C, NP  levothyroxine (SYNTHROID, LEVOTHROID) 100 MCG tablet Take 1 tablet (100 mcg total) by mouth daily. 05/10/17  Yes Mikey Kirschner, MD   No Known Allergies  FAMILY HISTORY:  family history includes Breast cancer in her sister; Emphysema (age of onset: 41) in her mother; Lung cancer (age of onset: 64) in her father. SOCIAL HISTORY:  reports that she has quit smoking. she has never used smokeless tobacco. She reports that she does not drink alcohol or use drugs.  REVIEW OF SYSTEMS:  Unable as patient is encephalopathic  SUBJECTIVE:   VITAL SIGNS: Temp:  [98 F (36.7 C)-98.7 F (37.1 C)] 98.7 F (37.1 C) (03/19 2113) Pulse Rate:  [53-148] 118 (03/19 1943) Resp:  [16-34] 32 (03/19 1943) BP: (101-191)/(45-99) 179/95 (03/19 1943) SpO2:  [86 %-100 %] 96 % (03/19 1943) Weight:  [68.9 kg (151 lb 14.4 oz)] 68.9 kg (151 lb 14.4 oz) (03/19 2113)  PHYSICAL EXAMINATION: General:  Elderly female in NAD Neuro:  Awake, alert, disoriented.  HEENT:  [place]/AT, PERRL, no JVD Cardiovascular:  Tachy. Regular, no MRG Lungs:  Clear bilateral breath sounds Abdomen:  Soft, non-tender, non-distended Musculoskeletal:  No acute deformity or ROM limitation.  Skin:  Grossly intact  Recent Labs  Lab 05/31/17 1547  NA 134*  K 4.5  CL 101  CO2 21*  BUN 29*  CREATININE 1.29*  GLUCOSE 216*   Recent Labs  Lab 05/31/17 1445 05/31/17  1547 06/01/17 0214  HGB 5.5* 14.4 13.2  HCT  --  44.8 41.1  WBC  --  8.3 5.3  PLT  --  155 140*   Ct Angio Chest Pe W And/or Wo Contrast  Result Date: 05/31/2017 CLINICAL DATA:  Anemia, hemoglobin 5.5 g, weakness, history asthma, former smoker, remote history of ovarian cancer EXAM: CT ANGIOGRAPHY CHEST WITH CONTRAST TECHNIQUE: Multidetector CT imaging of the chest was performed using the standard protocol during bolus  administration of intravenous contrast. Multiplanar CT image reconstructions and MIPs were obtained to evaluate the vascular anatomy. CONTRAST:  35mL ISOVUE-370 IOPAMIDOL (ISOVUE-370) INJECTION 76% IV COMPARISON:  None FINDINGS: Cardiovascular: Atherosclerotic calcifications aorta and coronary arteries. Aorta normal caliber. Pulmonary arteries well opacified. Large filling defects identified in the central pulmonary arteries bilaterally compatible with pulmonary embolism. These include a large saddle emboli at the bifurcations of the LEFT and RIGHT pulmonary arteries as well as a large thrombus within the proximal RIGHT pulmonary artery extending to the main pulmonary artery bifurcation. Emboli extend into all pulmonary lobes bilaterally greatest into RIGHT lower lobe. Dilatation of RIGHT atrium and RIGHT ventricle with an elevated RV/LV ratio = 1.81 consistent with RIGHT heart strain. No pericardial effusion. Mediastinum/Nodes: Esophagus unremarkable. Base of cervical region normal appearance. No thoracic adenopathy. Lungs/Pleura: Minimal atelectasis at LEFT lower lobe. Lungs otherwise clear. Upper Abdomen: Unremarkable Musculoskeletal: Pectus excavatum. No acute osseous findings. Bones demineralized. Review of the MIP images confirms the above findings. IMPRESSION: Multiple pulmonary emboli including saddle emboli at the bifurcations of the RIGHT and LEFT pulmonary arteries, with emboli seen extending into all lobes. Positive for acute PE with CT evidence of right heart strain (RV/LV Ratio = 1.81) consistent with at least submassive (intermediate risk) PE. The presence of right heart strain has been associated with an increased risk of morbidity and mortality. Please activate Code PE by paging [phone]. Scattered atherosclerotic calcifications of aorta and coronary arteries. Aortic Atherosclerosis (ICD10-I70.0). Critical Value/emergent results were called by telephone at the time of interpretation on 05/31/2017  at 6:11 pm to Dr. Merrily Pew , who verbally acknowledged these results. Electronically Signed   By: Lavonia Dana M.D.   On: 05/31/2017 18:12   Dg Chest Portable 1 View  Result Date: 05/31/2017 CLINICAL DATA:  Hypoxia, weakness, history asthma, former smoker EXAM: PORTABLE CHEST 1 VIEW COMPARISON:  Portable exam 1540 hours compared to 02/21/2016 FINDINGS: Enlargement of cardiac silhouette with pulmonary vascular congestion. Atherosclerotic calcification aorta. Mediastinal contours normal. Lungs clear. No acute infiltrate, pleural effusion or pneumothorax. Osseous demineralization with BILATERAL chronic rotator cuff tears. IMPRESSION: Enlargement of cardiac silhouette with pulmonary vascular congestion. No acute abnormalities. Electronically Signed   By: Lavonia Dana M.D.   On: 05/31/2017 15:58    ASSESSMENT / PLAN:  Pulmonary Emboli: Saddle. Large clot burden. RV/LV ratio 1.81 with elevated troponin, lactic, and BNP. Truly represents submassive PE. Clinically, upon time of arrival to ICU, she is stable. O2 sats on 2L 98% and appears to be breathing comfortably. She is tachycardic, but hypertensive. This is likely provoked by her spending several hours in seated position at a time every day.  - Telemetry monitoring - Continue heparin infusion per pharmacy - Continue oxygen supplementation to keep sats > 92% - Echocardiogram to evaluate true RV strain - Doppler legs - Ensure troponin clearing - Ideally would need 6 months to lifelong anticoagulation, but being that she is at risk for falls, perhaps a retrievable filter would be a better option.  - Doubt  she will be a candidate for thrombolysis based on relatively good clinical picture. In advanced age with very sedentary lifestyle, doubt there is much to be gained long term from thrombolysis. Acutely she is doing well.   Influenza A -Tamiflu - renal dosed  Hypothyroidism - check TSH - Continue home synthroid  Hyperglycemia with no history of  DM - Follow glucose on BMP  Global SUP: N/a VTE ppx: full dose heparin Code Status: DNR Dispo: ICU  Georgann Housekeeper, AGACNP-BC [place] [facility] Pager [phone] or [phone]  06/01/2017 10:00 PM

## 2017-06-01 NOTE — Progress Notes (Signed)
eLink Physician-Brief Progress Note Patient Name: TAMEIA RAFFERTY DOB: 02-16-1929 MRN: [id number]   Date of Service  06/01/2017  HPI/Events of Note  84 year old female with PMH as below, which is significant for asthma, ovarian cancer, and hypothyroidism. At baseline she also suffers from severe dementia. Presented to [facility] for SOB and hypoxia. Diagnosed with saddle PE with R heart strain. Now transferred from [facility] to [facility] under PCCM service for consideration of EKOS Rx. VSS.   eICU Interventions  No new orders.      Intervention Category Evaluation Type: New Patient Evaluation  Lysle Dingwall 06/01/2017, 10:22 PM

## 2017-06-01 NOTE — ED Notes (Signed)
Checked with Bed placement for bed status at [facility].  Per Dawn, "still waiting on discharges". Nurse and family informed.

## 2017-06-02 ENCOUNTER — Inpatient Hospital Stay (HOSPITAL_COMMUNITY): Payer: Medicare Other

## 2017-06-02 DIAGNOSIS — I351 Nonrheumatic aortic (valve) insufficiency: Secondary | ICD-10-CM

## 2017-06-02 DIAGNOSIS — I2699 Other pulmonary embolism without acute cor pulmonale: Secondary | ICD-10-CM

## 2017-06-02 DIAGNOSIS — J9601 Acute respiratory failure with hypoxia: Secondary | ICD-10-CM

## 2017-06-02 LAB — COMPREHENSIVE METABOLIC PANEL
ALT: 20 U/L (ref 14–54)
ANION GAP: 12 (ref 5–15)
AST: 36 U/L (ref 15–41)
Albumin: 3.4 g/dL — ABNORMAL LOW (ref 3.5–5.0)
Alkaline Phosphatase: 70 U/L (ref 38–126)
BUN: 15 mg/dL (ref 6–20)
CHLORIDE: 107 mmol/L (ref 101–111)
CO2: 20 mmol/L — AB (ref 22–32)
Calcium: 8.3 mg/dL — ABNORMAL LOW (ref 8.9–10.3)
Creatinine, Ser: 0.89 mg/dL (ref 0.44–1.00)
GFR calc non Af Amer: 57 mL/min — ABNORMAL LOW (ref >60)
Glucose, Bld: 145 mg/dL — ABNORMAL HIGH (ref 65–99)
Potassium: 3.7 mmol/L (ref 3.5–5.1)
SODIUM: 139 mmol/L (ref 135–145)
Total Bilirubin: 0.9 mg/dL (ref 0.3–1.2)
Total Protein: 6.5 g/dL (ref 6.5–8.1)

## 2017-06-02 LAB — BASIC METABOLIC PANEL
Anion gap: 9 (ref 5–15)
BUN: 16 mg/dL (ref 6–20)
CO2: 22 mmol/L (ref 22–32)
CREATININE: 0.88 mg/dL (ref 0.44–1.00)
Calcium: 8.1 mg/dL — ABNORMAL LOW (ref 8.9–10.3)
Chloride: 108 mmol/L (ref 101–111)
GFR calc Af Amer: >60 mL/min (ref >60)
GFR, EST NON AFRICAN AMERICAN: 58 mL/min — AB (ref >60)
GLUCOSE: 124 mg/dL — AB (ref 65–99)
POTASSIUM: 3.8 mmol/L (ref 3.5–5.1)
Sodium: 139 mmol/L (ref 135–145)

## 2017-06-02 LAB — TROPONIN I
TROPONIN I: 0.28 ng/mL — AB (ref <0.03)
TROPONIN I: 0.2 ng/mL — AB (ref <0.03)
TROPONIN I: 0.13 ng/mL — AB (ref <0.03)

## 2017-06-02 LAB — CBC
HCT: 39 % (ref 36.0–46.0)
Hemoglobin: 12.5 g/dL (ref 12.0–15.0)
MCH: 28.2 pg (ref 26.0–34.0)
MCHC: 32.1 g/dL (ref 30.0–36.0)
MCV: 87.8 fL (ref 78.0–100.0)
PLATELETS: 146 10*3/uL — AB (ref 150–400)
RBC: 4.44 MIL/uL (ref 3.87–5.11)
RDW: 14.6 % (ref 11.5–15.5)
WBC: 6.9 10*3/uL (ref 4.0–10.5)

## 2017-06-02 LAB — PHOSPHORUS: Phosphorus: 3.1 mg/dL (ref 2.5–4.6)

## 2017-06-02 LAB — PROCALCITONIN: Procalcitonin: <0.1 ng/mL

## 2017-06-02 LAB — HEMOGLOBIN A1C
Hgb A1c MFr Bld: 5.6 % (ref 4.8–5.6)
MEAN PLASMA GLUCOSE: 114.02 mg/dL

## 2017-06-02 LAB — GLUCOSE, CAPILLARY
GLUCOSE-CAPILLARY: 93 mg/dL (ref 65–99)
Glucose-Capillary: 140 mg/dL — ABNORMAL HIGH (ref 65–99)
Glucose-Capillary: 115 mg/dL — ABNORMAL HIGH (ref 65–99)
Glucose-Capillary: 96 mg/dL (ref 65–99)

## 2017-06-02 LAB — ECHOCARDIOGRAM COMPLETE
HEIGHTINCHES: 67 in
Weight: 2444.46 oz

## 2017-06-02 LAB — HEPARIN LEVEL (UNFRACTIONATED): HEPARIN UNFRACTIONATED: 0.48 [IU]/mL (ref 0.30–0.70)

## 2017-06-02 LAB — MRSA PCR SCREENING: MRSA by PCR: POSITIVE — AB

## 2017-06-02 LAB — MAGNESIUM: Magnesium: 1.9 mg/dL (ref 1.7–2.4)

## 2017-06-02 LAB — TSH: TSH: 5.361 u[IU]/mL — ABNORMAL HIGH (ref 0.350–4.500)

## 2017-06-02 MED ORDER — ACETAMINOPHEN 325 MG PO TABS
650.0000 mg | ORAL_TABLET | ORAL | Status: DC | PRN
  Administered 2017-06-02 – 2017-06-03 (×3): 650 mg via ORAL
  Filled 2017-06-02 (×3): qty 2

## 2017-06-02 MED ORDER — MUPIROCIN 2 % EX OINT
1.0000 "application " | TOPICAL_OINTMENT | Freq: Two times a day (BID) | CUTANEOUS | Status: DC
  Administered 2017-06-02 – 2017-06-05 (×8): 1 via NASAL
  Filled 2017-06-02 (×2): qty 22

## 2017-06-02 MED ORDER — HYDRALAZINE HCL 20 MG/ML IJ SOLN
10.0000 mg | Freq: Once | INTRAMUSCULAR | Status: AC
  Administered 2017-06-02: 10 mg via INTRAVENOUS
  Filled 2017-06-02: qty 1

## 2017-06-02 MED ORDER — CHLORHEXIDINE GLUCONATE CLOTH 2 % EX PADS
6.0000 | MEDICATED_PAD | Freq: Every day | CUTANEOUS | Status: DC
  Administered 2017-06-02: 6 via TOPICAL

## 2017-06-02 NOTE — Progress Notes (Signed)
  Echocardiogram 2D Echocardiogram has been performed.  Jennette Dubin 06/02/2017, 11:05 AM

## 2017-06-02 NOTE — Progress Notes (Signed)
ANTICOAGULATION CONSULT NOTE - Follow Up Consult  Pharmacy Consult:  Heparin Indication: pulmonary embolus  No Known Allergies  Patient Measurements: Height: 5\' 7"  (170.2 cm) Weight: 152 lb 12.5 oz (69.3 kg) IBW/kg (Calculated) : 61.6 Heparin Dosing Weight: 69 kg  Vital Signs: Temp: 97.6 F (36.4 C) (03/20 0357) Temp Source: Axillary (03/20 0357) BP: 143/89 (03/20 1000) Pulse Rate: 84 (03/20 1000)  Labs: Recent Labs    05/31/17 1547 06/01/17 0214 06/01/17 2244 06/02/17 0445  HGB 14.4 13.2 13.9 12.5  HCT 44.8 41.1 41.2 39.0  PLT 155 140* 159 146*  LABPROT 14.2  --   --   --   INR 1.11  --   --   --   HEPARINUNFRC  --  0.61 0.53 0.48  CREATININE 1.29*  --  0.89 0.88  TROPONINI 0.44*  --  0.28* 0.20*    Estimated Creatinine Clearance: 44.6 mL/min (by C-G formula based on SCr of 0.88 mg/dL).     Assessment: 64 YOF with PE to continue on IV heparin.  Heparin level is therapeutic; no bleeding reported.   Goal of Therapy:  Heparin level 0.3-0.7 units/ml Monitor platelets by anticoagulation protocol: Yes    Plan:  Continue heparin infusion at 800 units/hr Daily heparin level and CBC F/U with transitioning to oral [facility]  Consider checking a free T4 level   Ocean Kearley D. Mina Marble, PharmD, BCPS Pager:  [phone] 06/02/2017, 10:46 AM

## 2017-06-02 NOTE — Progress Notes (Signed)
eLink Physician-Brief Progress Note Patient Name: Tammy Mosley DOB: 1928/11/02 MRN: [id number]   Date of Service  06/02/2017  HPI/Events of Note  Oliguria - Bladder scan with 744 mL residual.   eICU Interventions  Will order:  1. I/O Cath PRN.     Intervention Category Intermediate Interventions: Oliguria - evaluation and management  Karin Pinedo Eugene 06/02/2017, 5:36 AM

## 2017-06-02 NOTE — Progress Notes (Signed)
Bilateral lower extremity venous duplex has been completed. Negative for DVT.  06/02/17 11:51 AM Carlos Levering RVT

## 2017-06-02 NOTE — Progress Notes (Addendum)
PROGRESS NOTE   Tammy Mosley  LKG:[id number]    DOB: December 23, 1928    DOA: 05/31/2017  PCP: Mikey Kirschner, MD   I have briefly reviewed patients previous medical records in [facility].  Brief Narrative:  84 year old female, PMH of severe dementia (at baseline recognizes family members, not oriented to year, intermittently oriented to place), asthma, ovarian cancer status post TAH/BSO and chronic kidney disease who presented to an outside hospital on 3/18 with complaints of fatigue of 2 days duration.  Her whole family had the flu last week and patient was also diagnosed with flu at the OSH ER.  At some point a CTA chest was obtained even though patient did not have any dyspnea and that revealed bilateral PEs with saddle embolus.  She was transferred to [facility] for evaluation for possible TPA.  CCM initially admitted patient and did not see any indications for thrombolytics and recommended continuing IV heparin infusion, Tamiflu for influenza.  Patient transferred to [facility] on 06/02/17.   Assessment & Plan:   Active Problems:   Pulmonary embolus (HCC)   Pulmonary embolism (HCC)   Saddle pulmonary embolism/submassive pulmonary embolism: Patient has remained hemodynamically stable without hypotension.  Saturating at 100% on 3 L/min [place] oxygen.  As per PCCM input on admission, not candidate for thrombolytics and recommended continuing IV heparin infusion.  Lower extremity venous Dopplers negative for DVT.  TTE: LVEF 66-44%, grade 1 diastolic dysfunction and RV mildly dilated with mildly reduced systolic function.  Continue IV heparin infusion for another 24 hours.  Plan to discuss with family regarding risks and benefits of long-term anticoagulation, various options available.  Duration of anticoagulation at least 6 months.  Elevated troponin: Likely from demand ischemia related to saddle PE.  Troponin trending down.  TTE with normal LVEF.  Influenza A with acute bronchitis: Complete course of  Tamiflu.  Acute respiratory failure with hypoxia: Secondary to PE and influenza.  Wean oxygen as tolerated for saturations >92%.  Hypothyroid: Continue Synthroid.  TSH 5.361.  May consider increasing dose mildly versus repeating TSH in a few weeks.  Thrombocytopenia: Mild.  Monitor closely while on IV heparin drip.  Advanced dementia with behavioral abnormalities: Delirium precautions.  PRN Haldol if absolutely necessary. Has been on Precedix since this am.   Hyperglycemia: No DM history.  Check A1c.  Acute urinary retention: Overnight 3/19, needed in and out cath for bladder scan that showed 744 mL residual urine.  Currently has pure wick.  CKD stage 3: stable     DVT prophylaxis: On full dose IV heparin infusion Code Status: DNR Family Communication: None at bedside Disposition: To be determined.   Consultants:  CCM-signed off 3/19  Procedures:  None  Antimicrobials:  Tamiflu   Subjective: Overnight events noted.  Patient poor historian.  Somnolent this morning but wakes up in tells her name.  "I feel bad" but does not elaborate.  Specifically denies chest pain or dyspnea.  ROS: As above  Objective:  Vitals:   06/02/17 1227 06/02/17 1300 06/02/17 1400 06/02/17 1529  BP: 109/69 (!) 158/76 (!) 155/95 (!) 158/91  Pulse:  71 86   Resp:  (!) 21 (!) 25   Temp: 98.2 F (36.8 C)   (!) 97.2 F (36.2 C)  TempSrc: Axillary   Axillary  SpO2:  99% 100%   Weight:      Height:        Examination:  General exam: Pleasant elderly female, moderately built and nourished, lying comfortably  propped up in bed. Respiratory system: Clear to auscultation. Respiratory effort normal. Cardiovascular system: S1 & S2 heard, RRR. No JVD, murmurs, rubs, gallops or clicks. No pedal edema.  Telemetry personally reviewed: Sinus rhythm. Gastrointestinal system: Abdomen is nondistended, soft and nontender. No organomegaly or masses felt. Normal bowel sounds heard. Central nervous system:  Mental status as noted above. No focal neurological deficits. Extremities: Symmetrically moves all limbs with seemingly normal power. Skin: No rashes, lesions or ulcers Psychiatry: Judgement and insight impaired. Mood & affect unable to assess.     Data Reviewed: I have personally reviewed following labs and imaging studies  CBC: Recent Labs  Lab 05/31/17 1445 05/31/17 1547 06/01/17 0214 06/01/17 2244 06/02/17 0445  WBC  --  8.3 5.3 9.5 6.9  NEUTROABS  --  6.7  --  7.5  --   HGB 5.5* 14.4 13.2 13.9 12.5  HCT  --  44.8 41.1 41.2 39.0  MCV  --  88.9 88.0 86.2 87.8  PLT  --  155 140* 159 937*   Basic Metabolic Panel: Recent Labs  Lab 05/31/17 1547 06/01/17 2244 06/02/17 0445  NA 134* 139 139  K 4.5 3.7 3.8  CL 101 107 108  CO2 21* 20* 22  GLUCOSE 216* 145* 124*  BUN 29* 15 16  CREATININE 1.29* 0.89 0.88  CALCIUM 8.4* 8.3* 8.1*  MG  --   --  1.9  PHOS  --   --  3.1   Liver Function Tests: Recent Labs  Lab 05/31/17 1547 06/01/17 2244  AST 38 36  ALT 23 20  ALKPHOS 81 70  BILITOT 0.9 0.9  PROT 7.1 6.5  ALBUMIN 3.5 3.4*   Coagulation Profile: Recent Labs  Lab 05/31/17 1547  INR 1.11   Cardiac Enzymes: Recent Labs  Lab 05/31/17 1547 06/01/17 2244 06/02/17 0445 06/02/17 1143  TROPONINI 0.44* 0.28* 0.20* 0.13*   HbA1C: No results for input(s): HGBA1C in the last 72 hours. CBG: Recent Labs  Lab 06/01/17 2111 06/01/17 2350 06/02/17 0347 06/02/17 0726 06/02/17 1225  GLUCAP 172* 153* 140* 115* 93    Recent Results (from the past 240 hour(s))  MRSA PCR Screening     Status: Abnormal   Collection Time: 06/01/17  9:23 PM  Result Value Ref Range Status   MRSA by PCR POSITIVE (A) NEGATIVE Final    Comment:        The GeneXpert MRSA Assay (FDA approved for NASAL specimens only), is one component of a comprehensive MRSA colonization surveillance program. It is not intended to diagnose MRSA infection nor to guide or monitor treatment for MRSA  infections. RESULT CALLED TO, READ BACK BY AND VERIFIED WITH: B CUMMINGS RN 06/02/17 0202 JDW Performed at [facility], [place] [address], [place], [place] [address]          Radiology Studies: Ct Angio Chest Pe W And/or Wo Contrast  Result Date: 05/31/2017 CLINICAL DATA:  Anemia, hemoglobin 5.5 g, weakness, history asthma, former smoker, remote history of ovarian cancer EXAM: CT ANGIOGRAPHY CHEST WITH CONTRAST TECHNIQUE: Multidetector CT imaging of the chest was performed using the standard protocol during bolus administration of intravenous contrast. Multiplanar CT image reconstructions and MIPs were obtained to evaluate the vascular anatomy. CONTRAST:  68mL ISOVUE-370 IOPAMIDOL (ISOVUE-370) INJECTION 76% IV COMPARISON:  None FINDINGS: Cardiovascular: Atherosclerotic calcifications aorta and coronary arteries. Aorta normal caliber. Pulmonary arteries well opacified. Large filling defects identified in the central pulmonary arteries bilaterally compatible with pulmonary embolism. These include a  large saddle emboli at the bifurcations of the LEFT and RIGHT pulmonary arteries as well as a large thrombus within the proximal RIGHT pulmonary artery extending to the main pulmonary artery bifurcation. Emboli extend into all pulmonary lobes bilaterally greatest into RIGHT lower lobe. Dilatation of RIGHT atrium and RIGHT ventricle with an elevated RV/LV ratio = 1.81 consistent with RIGHT heart strain. No pericardial effusion. Mediastinum/Nodes: Esophagus unremarkable. Base of cervical region normal appearance. No thoracic adenopathy. Lungs/Pleura: Minimal atelectasis at LEFT lower lobe. Lungs otherwise clear. Upper Abdomen: Unremarkable Musculoskeletal: Pectus excavatum. No acute osseous findings. Bones demineralized. Review of the MIP images confirms the above findings. IMPRESSION: Multiple pulmonary emboli including saddle emboli at the bifurcations of the RIGHT and LEFT pulmonary arteries, with emboli  seen extending into all lobes. Positive for acute PE with CT evidence of right heart strain (RV/LV Ratio = 1.81) consistent with at least submassive (intermediate risk) PE. The presence of right heart strain has been associated with an increased risk of morbidity and mortality. Please activate Code PE by paging [phone]. Scattered atherosclerotic calcifications of aorta and coronary arteries. Aortic Atherosclerosis (ICD10-I70.0). Critical Value/emergent results were called by telephone at the time of interpretation on 05/31/2017 at 6:11 pm to Dr. Merrily Pew , who verbally acknowledged these results. Electronically Signed   By: Lavonia Dana M.D.   On: 05/31/2017 18:12        Scheduled Meds: . Chlorhexidine Gluconate Cloth  6 each Topical Q0600  . dorzolamide  1 drop Both Eyes BID  . levothyroxine  100 mcg Oral QAC breakfast  . mouth rinse  15 mL Mouth Rinse BID  . mupirocin ointment  1 application Nasal BID  . oseltamivir  30 mg Oral BID   Continuous Infusions: . sodium chloride    . sodium chloride    . sodium chloride 50 mL/hr at 06/02/17 0500  . dexmedetomidine (PRECEDEX) IV infusion Stopped (06/02/17 4193)  . heparin 800 Units/hr (06/02/17 0800)     LOS: 2 days     Vernell Leep, MD, FACP, [facility]. [facility] Pager [phone]  If 7PM-7AM, please contact night-coverage [email or web address] Password [email or web address] 06/02/2017, 5:25 PM

## 2017-06-03 ENCOUNTER — Inpatient Hospital Stay (HOSPITAL_COMMUNITY): Payer: Medicare Other

## 2017-06-03 DIAGNOSIS — J111 Influenza due to unidentified influenza virus with other respiratory manifestations: Secondary | ICD-10-CM

## 2017-06-03 LAB — CBC
HCT: 43.7 % (ref 36.0–46.0)
Hemoglobin: 14.3 g/dL (ref 12.0–15.0)
MCH: 28.5 pg (ref 26.0–34.0)
MCHC: 32.7 g/dL (ref 30.0–36.0)
MCV: 87.1 fL (ref 78.0–100.0)
PLATELETS: 133 10*3/uL — AB (ref 150–400)
RBC: 5.02 MIL/uL (ref 3.87–5.11)
RDW: 14.5 % (ref 11.5–15.5)
WBC: 5.6 10*3/uL (ref 4.0–10.5)

## 2017-06-03 LAB — HEPARIN LEVEL (UNFRACTIONATED): HEPARIN UNFRACTIONATED: 0.4 [IU]/mL (ref 0.30–0.70)

## 2017-06-03 MED ORDER — IPRATROPIUM-ALBUTEROL 0.5-2.5 (3) MG/3ML IN SOLN: 3.0000 mL | Freq: Three times a day (TID) | RESPIRATORY_TRACT | Status: DC

## 2017-06-03 MED ORDER — RIVAROXABAN 15 MG PO TABS: 15.0000 mg | ORAL_TABLET | Freq: Two times a day (BID) | ORAL | Status: DC

## 2017-06-03 MED ORDER — LEVOTHYROXINE SODIUM 100 MCG PO TABS
100.0000 ug | ORAL_TABLET | Freq: Every day | ORAL | Status: DC
  Administered 2017-06-04 – 2017-06-05 (×2): 100 ug via ORAL
  Filled 2017-06-03 (×2): qty 1

## 2017-06-03 MED ORDER — CHLORHEXIDINE GLUCONATE CLOTH 2 % EX PADS
6.0000 | MEDICATED_PAD | Freq: Every day | CUTANEOUS | Status: DC
  Administered 2017-06-03: 6 via TOPICAL

## 2017-06-03 MED ORDER — HALOPERIDOL LACTATE 5 MG/ML IJ SOLN: 1.0000 mg | Freq: Two times a day (BID) | INTRAMUSCULAR | Status: DC | PRN

## 2017-06-03 MED ORDER — RIVAROXABAN 20 MG PO TABS: 20.0000 mg | ORAL_TABLET | Freq: Every day | ORAL | Status: DC

## 2017-06-03 MED ORDER — DOCUSATE SODIUM 100 MG PO CAPS
100.0000 mg | ORAL_CAPSULE | Freq: Two times a day (BID) | ORAL | Status: DC
  Administered 2017-06-04 – 2017-06-05 (×3): 100 mg via ORAL
  Filled 2017-06-03 (×3): qty 1

## 2017-06-03 MED ORDER — IPRATROPIUM-ALBUTEROL 0.5-2.5 (3) MG/3ML IN SOLN
3.0000 mL | Freq: Three times a day (TID) | RESPIRATORY_TRACT | Status: DC
  Administered 2017-06-03: 3 mL via RESPIRATORY_TRACT
  Filled 2017-06-03: qty 3

## 2017-06-03 MED ORDER — RIVAROXABAN 15 MG PO TABS
15.0000 mg | ORAL_TABLET | Freq: Two times a day (BID) | ORAL | Status: DC
  Administered 2017-06-03 – 2017-06-05 (×4): 15 mg via ORAL
  Filled 2017-06-03 (×4): qty 1

## 2017-06-03 MED ORDER — BISACODYL 5 MG PO TBEC
10.0000 mg | DELAYED_RELEASE_TABLET | Freq: Once | ORAL | Status: AC
  Administered 2017-06-03: 10 mg via ORAL
  Filled 2017-06-03: qty 2

## 2017-06-03 MED ORDER — DOCUSATE SODIUM 100 MG PO CAPS: 100.0000 mg | ORAL_CAPSULE | Freq: Two times a day (BID) | ORAL | Status: DC

## 2017-06-03 MED ORDER — HYDRALAZINE HCL 20 MG/ML IJ SOLN: 10.0000 mg | Freq: Three times a day (TID) | INTRAMUSCULAR | Status: DC | PRN

## 2017-06-03 MED ORDER — ALBUTEROL SULFATE (2.5 MG/3ML) 0.083% IN NEBU: 2.5000 mg | INHALATION_SOLUTION | RESPIRATORY_TRACT | Status: DC | PRN

## 2017-06-03 MED ORDER — DM-GUAIFENESIN ER 30-600 MG PO TB12
1.0000 | ORAL_TABLET | Freq: Two times a day (BID) | ORAL | Status: DC
  Administered 2017-06-03 – 2017-06-05 (×5): 1 via ORAL
  Filled 2017-06-03 (×5): qty 1

## 2017-06-03 MED ORDER — RIVAROXABAN (XARELTO) EDUCATION KIT FOR DVT/PE PATIENTS
PACK | Freq: Once | Status: DC
  Filled 2017-06-03: qty 1

## 2017-06-03 MED ORDER — CHLORHEXIDINE GLUCONATE CLOTH 2 % EX PADS
6.0000 | MEDICATED_PAD | Freq: Every day | CUTANEOUS | Status: DC
  Administered 2017-06-05: 6 via TOPICAL

## 2017-06-03 NOTE — Progress Notes (Addendum)
PROGRESS NOTE   Tammy Mosley  NOB:[id number]    DOB: 18-Dec-1928    DOA: 05/31/2017  PCP: Mikey Kirschner, MD   I have briefly reviewed patients previous medical records in [facility].  Brief Narrative:  84 year old female, PMH of severe dementia (at baseline recognizes family members, not oriented to year, intermittently oriented to place), asthma, ovarian cancer status post TAH/BSO and chronic kidney disease who presented to an outside hospital on 3/18 with complaints of fatigue of 2 days duration.  Her whole family had the flu last week and patient was also diagnosed with flu at the OSH ER.  At some point a CTA chest was obtained even though patient did not have any dyspnea and that revealed bilateral PEs with saddle embolus.  She was transferred to [facility] for evaluation for possible TPA.  CCM initially admitted patient and did not see any indications for thrombolytics and recommended continuing IV heparin infusion, Tamiflu for influenza.  Patient transferred to [facility] on 06/02/17.  After discussing with family, transitioned from IV heparin to Xarelto on 3/21.  PT evaluation.   Assessment & Plan:   Active Problems:   Pulmonary embolus (HCC)   Pulmonary embolism (HCC)   Saddle pulmonary embolism/submassive pulmonary embolism: Patient has remained hemodynamically stable without hypotension.  Saturating at 100% on 3 L/min [place] oxygen.  As per PCCM input on admission, not candidate for thrombolytics and recommended continuing IV heparin infusion.  Lower extremity venous Dopplers negative for DVT.  TTE: LVEF 94-76%, grade 1 diastolic dysfunction and RV mildly dilated with mildly reduced systolic function.  I discussed in detail with patient's son on 3/21 including risks and benefits of anticoagulation, options for oral anticoagulation including warfarin, NOAC's (Xarelto & Eliquis) & Pradaxa including risks and benefits of each.  He opted for Xarelto.  Start Xarelto per pharmacy.  Duration of  anticoagulation at least 6 months and may be longer but will defer to her PCP to decide.  As per son, no prior episodes of VTE and he reports that patient's ovarian cancer is in remission since the 90s.  Elevated troponin: Likely from demand ischemia related to saddle PE.  Troponin trending down.  TTE with normal LVEF.  Influenza A with acute bronchitis: Complete course of Tamiflu.  Chest x-ray 3/21 personally reviewed and no acute findings.  Supportive treatment with bronchodilator nebulizations as needed and cough syrup.  Acute respiratory failure with hypoxia: Secondary to PE and influenza.  Wean oxygen as tolerated for saturations >92%.  Hypothyroid: Continue Synthroid.  TSH 5.361.  May consider increasing dose mildly versus repeating TSH in a few weeks.  Thrombocytopenia: Mild.  Monitor closely while on IV heparin drip.  Follow CBC in a.m.  Advanced dementia with behavioral abnormalities: Delirium precautions.  PRN Haldol if absolutely necessary.  No agitation reported overnight.  Hyperglycemia: No DM history.  Check A1c: 5.6.  Acute urinary retention: Overnight 3/19, needed in and out cath for bladder scan that showed 744 mL residual urine.  Currently has pure wick despite which had ongoing urinary retention, Foley catheter placed and 800 mL emptied 3/21.  As discussed with son, this may be a chronic issue.  May need to consider discharging on Foley catheter versus voiding trial prior to discharge.  CKD stage 3: stable  Elevated blood pressure: Possibly chronic hypertension.  PRN IV hydralazine for now.  Monitor.     DVT prophylaxis: On full dose IV heparin infusion.  Transition to Xarelto per pharmacy on 3/21 Code  Status: DNR Family Communication: Discussed in detail with patient's son on 3/21 Disposition: Pending further clinical improvement/stability, transitioning to oral Xarelto, PT evaluation.  If PT determines that patient needs to go to SNF, son prefers that patient goes to  the same SNF that her husband is currently at.   Consultants:  CCM-signed off 3/19  Procedures:  Foley catheter 3/21 >.  Antimicrobials:  Tamiflu   Subjective: States that she feels "okay".  Reports ongoing chest congestion, dry cough and intermittent mild dyspnea.  As per RN, bladder scan with 891 mL urine.  Subsequently 800 mL urine emptied with catheter.  ROS: As above  Objective:  Vitals:   06/03/17 0700 06/03/17 0745 06/03/17 0800 06/03/17 1000  BP:   (!) 168/94 (!) 168/94  Pulse: 81  83 92  Resp: (!) 22  (!) 29 (!) 28  Temp:  (!) 97.4 F (36.3 C)    TempSrc:  Oral    SpO2: 100%  99% 99%  Weight:      Height:        Examination:  General exam: Pleasant elderly female, moderately built and nourished, lying comfortably propped up in bed.  Does not appear in any distress. Respiratory system: Slightly harsh breath sounds bilaterally with scattered occasional expiratory rhonchi but no crackles.  No increased work of breathing. Cardiovascular system: S1 & S2 heard, RRR. No JVD, murmurs, rubs, gallops or clicks. No pedal edema.  Telemetry personally reviewed: Sinus rhythm. Gastrointestinal system: Abdomen is nondistended, soft and nontender. No organomegaly or masses felt. Normal bowel sounds heard.  Stable without change. Central nervous system: Alert and oriented only to self.  Follow simple instructions. No focal neurological deficits. Extremities: Symmetrically moves all limbs with seemingly normal power. Skin: No rashes, lesions or ulcers Psychiatry: Judgement and insight impaired. Mood & affect pleasant and appropriate today.     Data Reviewed: I have personally reviewed following labs and imaging studies  CBC: Recent Labs  Lab 05/31/17 1547 06/01/17 0214 06/01/17 2244 06/02/17 0445 06/03/17 0514  WBC 8.3 5.3 9.5 6.9 5.6  NEUTROABS 6.7  --  7.5  --   --   HGB 14.4 13.2 13.9 12.5 14.3  HCT 44.8 41.1 41.2 39.0 43.7  MCV 88.9 88.0 86.2 87.8 87.1  PLT 155  140* 159 146* 481*   Basic Metabolic Panel: Recent Labs  Lab 05/31/17 1547 06/01/17 2244 06/02/17 0445  NA 134* 139 139  K 4.5 3.7 3.8  CL 101 107 108  CO2 21* 20* 22  GLUCOSE 216* 145* 124*  BUN 29* 15 16  CREATININE 1.29* 0.89 0.88  CALCIUM 8.4* 8.3* 8.1*  MG  --   --  1.9  PHOS  --   --  3.1   Liver Function Tests: Recent Labs  Lab 05/31/17 1547 06/01/17 2244  AST 38 36  ALT 23 20  ALKPHOS 81 70  BILITOT 0.9 0.9  PROT 7.1 6.5  ALBUMIN 3.5 3.4*   Coagulation Profile: Recent Labs  Lab 05/31/17 1547  INR 1.11   Cardiac Enzymes: Recent Labs  Lab 05/31/17 1547 06/01/17 2244 06/02/17 0445 06/02/17 1143  TROPONINI 0.44* 0.28* 0.20* 0.13*   HbA1C: Recent Labs    06/02/17 1744  HGBA1C 5.6   CBG: Recent Labs  Lab 06/01/17 2350 06/02/17 0347 06/02/17 0726 06/02/17 1225 06/02/17 2019  GLUCAP 153* 140* 115* 93 96    Recent Results (from the past 240 hour(s))  MRSA PCR Screening     Status: Abnormal  Collection Time: 06/01/17  9:23 PM  Result Value Ref Range Status   MRSA by PCR POSITIVE (A) NEGATIVE Final    Comment:        The GeneXpert MRSA Assay (FDA approved for NASAL specimens only), is one component of a comprehensive MRSA colonization surveillance program. It is not intended to diagnose MRSA infection nor to guide or monitor treatment for MRSA infections. RESULT CALLED TO, READ BACK BY AND VERIFIED WITH: B CUMMINGS RN 06/02/17 0202 JDW Performed at [facility], [place] [address] [place], [place] [address]          Radiology Studies: Dg Chest Port 1 View  Result Date: 06/03/2017 CLINICAL DATA:  Flu, cough, congestion EXAM: PORTABLE CHEST 1 VIEW COMPARISON:  CT chest 05/31/2017 FINDINGS: There is no focal parenchymal opacity. There is no pleural effusion or pneumothorax. There is stable cardiomegaly. There is thoracic aortic atherosclerosis. There is moderate osteoarthritis of bilateral glenohumeral joints. IMPRESSION: No  active disease. Electronically Signed   By: Kathreen Devoid   On: 06/03/2017 10:00        Scheduled Meds: . Chlorhexidine Gluconate Cloth  6 each Topical Q0600  . dextromethorphan-guaiFENesin  1 tablet Oral BID  . dorzolamide  1 drop Both Eyes BID  . ipratropium-albuterol  3 mL Nebulization TID  . levothyroxine  100 mcg Oral QAC breakfast  . mouth rinse  15 mL Mouth Rinse BID  . mupirocin ointment  1 application Nasal BID  . oseltamivir  30 mg Oral BID   Continuous Infusions: . sodium chloride    . sodium chloride 50 mL/hr at 06/03/17 0400  . heparin 800 Units/hr (06/03/17 8329)     LOS: 3 days     Vernell Leep, MD, FACP, [facility]. [facility] Pager [phone]  If 7PM-7AM, please contact night-coverage [email or web address] Password [facility] 06/03/2017, 12:47 PM

## 2017-06-03 NOTE — Progress Notes (Signed)
Patient retaining urine. MD Hongalgi aware. Orders to place foley because of urinary retention.  Bladder scan at 0903 showed 82mL retained. Once the foley was placed, 800 mL of urine was emptied.

## 2017-06-03 NOTE — Discharge Instructions (Addendum)
Information on my medicine - XARELTO (rivaroxaban)  This medication education was reviewed with me or my healthcare representative as part of my discharge preparation.  The pharmacist that spoke with me during my hospital stay was:  Brain Hilts, [place]? Xarelto was prescribed to treat blood clots that may have been found in the veins of your legs (deep vein thrombosis) or in your lungs (pulmonary embolism) and to reduce the risk of them occurring again.  What do you need to know about Xarelto? The starting dose is one 15 mg tablet taken TWICE daily with food for the FIRST 21 DAYS then on Thursday 06/24/17  the dose is changed to one 20 mg tablet taken ONCE A DAY with your evening meal.  DO NOT stop taking Xarelto without talking to the health care provider who prescribed the medication.  Refill your prescription for 20 mg tablets before you run out.  After discharge, you should have regular check-up appointments with your healthcare provider that is prescribing your Xarelto.  In the future your dose may need to be changed if your kidney function changes by a significant amount.  What do you do if you miss a dose? If you are taking Xarelto TWICE DAILY and you miss a dose, take it as soon as you remember. You may take two 15 mg tablets (total 30 mg) at the same time then resume your regularly scheduled 15 mg twice daily the next day.  If you are taking Xarelto ONCE DAILY and you miss a dose, take it as soon as you remember on the same day then continue your regularly scheduled once daily regimen the next day. Do not take two doses of Xarelto at the same time.   Important Safety Information Xarelto is a blood thinner medicine that can cause bleeding. You should call your healthcare provider right away if you experience any of the following: ? Bleeding from an injury or your nose that does not stop. ? Unusual colored urine (red or dark brown) or  unusual colored stools (red or black). ? Unusual bruising for unknown reasons. ? A serious fall or if you hit your head (even if there is no bleeding).  Some medicines may interact with Xarelto and might increase your risk of bleeding while on Xarelto. To help avoid this, consult your healthcare provider or pharmacist prior to using any new prescription or non-prescription medications, including herbals, vitamins, non-steroidal anti-inflammatory drugs (NSAIDs) and supplements.  This website has more information on Xarelto: [email or web address]

## 2017-06-03 NOTE — Evaluation (Signed)
Physical Therapy Evaluation Patient Details Name: Tammy Mosley [facility] MRN: [id number] DOB: 23-Dec-1928 Today's Date: 06/03/2017   History of Present Illness  84 y.o. female admitted on 05/31/17 dx with saddle PE.  Pt with significant PMH of dementia, severe glaucoma, cataracts, frequent PVCs, and insomnia.  Clinical Impression  Pt is weak and deconditioned with low activity tolerance (HR up to 140, O2 sats down to 84% on RA at one point during our session).  Having son present during our session helped with her general anxiety of me moving her and he was physically helpful.  She will likely need SNF level rehab at discharge, but I am going to have someone come by tomorrow around lunch time when son plans to be here to attempt gait with RW to solidify that plan. Her husband is in SNF in [place] at [facility] and they would like to pursue placement there to be close to him.  PT to follow acutely until d/c confirmed.       Follow Up Recommendations SNF;Supervision/Assistance - 24 hour(reports husband is in SNF in [place] where [facility] )    Equipment Recommendations  None recommended by PT    Recommendations for Other Services   NA    Precautions / Restrictions Precautions Precautions: Fall;Other (comment) Precaution Comments: very poor vision at baseline as well as dementia      Mobility  Bed Mobility Overal bed mobility: Needs Assistance Bed Mobility: Supine to Sit;Sit to Supine     Supine to sit: Mod assist;HOB elevated Sit to supine: +2 for physical assistance;Mod assist   General bed mobility comments: Mod assist to pull up to sitting EOB and help weight shift hips to scoot out so her feet touch.  Two person assist to return to supine as pt was anxious and grasping for support.  Son helped with her trunk and therapist helped lift both legs.  Pt wanted her head up quickly once she was supine as she felt she could not breathe well lying flat.  O2 sats dipped in this short time to  84% on RA.  Otherwise, she did well with sats 88-94 on RA for the rest of the session.  2 L O2 [place] returned to nose once HOB raised.   Transfers Overall transfer level: Needs assistance Equipment used: 2 person hand held assist Transfers: Sit to/from Stand Sit to Stand: +2 physical assistance;Mod assist         General transfer comment: Posterior preference with bil lower legs agaist the bed in intial standing.  Son assisting from one side and therapist from the other side.    Ambulation/Gait Ambulation/Gait assistance: +2 physical assistance;Mod assist Ambulation Distance (Feet): 5 Feet Assistive device: 2 person hand held assist Gait Pattern/deviations: Step-to pattern     General Gait Details: We side stepped up to [facility] and pt had very short, choppy steps, less posterior tilt once she got her feet under her and started stepping.           Balance Overall balance assessment: Needs assistance Sitting-balance support: Feet supported;Bilateral upper extremity supported Sitting balance-Leahy Scale: Poor Sitting balance - Comments: Min to min guard assist EOB.   Postural control: Posterior lean Standing balance support: Bilateral upper extremity supported Standing balance-Leahy Scale: Poor Standing balance comment: needs assist in standing.                             Pertinent Vitals/Pain Pain Assessment: Faces Faces  Pain Scale: Hurts little more Pain Location: posterior head and neck Pain Descriptors / Indicators: Grimacing;Guarding Pain Intervention(s): Limited activity within patient's tolerance;Monitored during session;Repositioned    Home Living Family/patient expects to be discharged to:: Private residence Living Arrangements: Children;Other (Comment)(son and daughter in law) Available Help at Discharge: Family;Available 24 hours/day Type of Home: House Home Access: Stairs to enter Entrance Stairs-Rails: None Entrance Stairs-Number of Steps: 3 Home  Layout: One level Home Equipment: Walker - 2 wheels;Shower seat;Toilet riser      Prior Function Level of Independence: Needs assistance   Gait / Transfers Assistance Needed: supervision with RW pt prefers someone walk with her as she cannot see obstacles well.  Assist on the stairs   ADL's / Homemaking Assistance Needed: Daughter in law reports assisting her daily with sponge bath and 1-2 times per week getting her in the shower.    Comments: Pt daily goes to visit her husband at the SNF with the family who is in a SNF.         Extremity/Trunk Assessment   Upper Extremity Assessment Upper Extremity Assessment: Generalized weakness    Lower Extremity Assessment Lower Extremity Assessment: Generalized weakness    Cervical / Trunk Assessment Cervical / Trunk Assessment: Other exceptions Cervical / Trunk Exceptions: pt reporting neck soreness and pain posteriorly.   Communication   Communication: No difficulties  Cognition Arousal/Alertness: Awake/alert Behavior During Therapy: Restless;Anxious Overall Cognitive Status: History of cognitive impairments - at baseline                                 General Comments: Per son report, today, she is more at her baseline cognition now than when she was admitted.              Assessment/Plan    PT Assessment Patient needs continued PT services  PT Problem List Decreased strength;Decreased activity tolerance;Decreased balance;Decreased mobility;Decreased cognition;Decreased knowledge of use of DME;Decreased safety awareness;Cardiopulmonary status limiting activity;Pain       PT Treatment Interventions DME instruction;Gait training;Stair training;Functional mobility training;Therapeutic activities;Therapeutic exercise;Balance training;Cognitive remediation;Patient/family education    PT Goals (Current goals can be found in the Care Plan section)  Acute Rehab PT Goals Patient Stated Goal: son is interested in SNF  with her husband for rehab PT Goal Formulation: With family Time For Goal Achievement: 06/17/17 Potential to Achieve Goals: Good    Frequency Min 3X/week           AM-PAC PT "6 Clicks" Daily Activity  Outcome Measure Difficulty turning over in bed (including adjusting bedclothes, sheets and blankets)?: Unable Difficulty moving from lying on back to sitting on the side of the bed? : Unable Difficulty sitting down on and standing up from a chair with arms (e.g., wheelchair, bedside commode, etc,.)?: Unable Help needed moving to and from a bed to chair (including a wheelchair)?: A Lot Help needed walking in hospital room?: A Lot Help needed climbing 3-5 steps with a railing? : Total 6 Click Score: 8    End of Session   Activity Tolerance: Patient limited by fatigue;Other (comment)(limited by anxiety) Patient left: in bed;with call bell/phone within reach;with bed alarm set;with family/visitor present Nurse Communication: Mobility status PT Visit Diagnosis: Muscle weakness (generalized) (M62.81);Difficulty in walking, not elsewhere classified (R26.2)    Time: 1497-0263 PT Time Calculation (min) (ACUTE ONLY): 27 min   Charges:            Wells Guiles  Hervey Ard, PT, DPT [phone]   PT Evaluation $PT Eval Moderate Complexity: 1 Mod PT Treatments $Therapeutic Activity: 8-22 mins       06/03/2017, 5:02 PM

## 2017-06-03 NOTE — Progress Notes (Signed)
ANTICOAGULATION CONSULT NOTE - Initial Consult  Pharmacy Consult for Xarelto (Change from IV Heparin) Indication: pulmonary embolus  No Known Allergies  Patient Measurements: Height: 5\' 7"  (170.2 cm) Weight: 153 lb 14.1 oz (69.8 kg) IBW/kg (Calculated) : 61.6  Vital Signs: Temp: 97.4 F (36.3 C) (03/21 0745) Temp Source: Oral (03/21 0745) BP: 169/88 (03/21 1200) Pulse Rate: 107 (03/21 1200)  Labs: Recent Labs    05/31/17 1547  06/01/17 2244 06/02/17 0445 06/02/17 1143 06/03/17 0514  HGB 14.4   < > 13.9 12.5  --  14.3  HCT 44.8   < > 41.2 39.0  --  43.7  PLT 155   < > 159 146*  --  133*  LABPROT 14.2  --   --   --   --   --   INR 1.11  --   --   --   --   --   HEPARINUNFRC  --    < > 0.53 0.48  --  0.40  CREATININE 1.29*  --  0.89 0.88  --   --   TROPONINI 0.44*  --  0.28* 0.20* 0.13*  --    < > = values in this interval not displayed.    Estimated Creatinine Clearance: 44.6 mL/min (by C-G formula based on SCr of 0.88 mg/dL).   Medical History: Past Medical History:  Diagnosis Date  . Asthma   . Cataracts, bilateral   . Constipation   . Frequent PVCs   . Glaucoma   . Hypothyroidism   . Insomnia   . Ovarian cancer ([place]) 1999  . S/P colonoscopy 2004   Dr. Lizbeth Bark, [facility], diverticulosis and hemorrhoids    Medications:  Infusions:  . sodium chloride    . heparin 800 Units/hr (06/03/17 1610)    Assessment: 84 year old female s/p massive acute pulmonary embolism on IV Heparin with need to transition to Xarelto per pharmacy dosing.  Patient has been therapeutic on IV Heparin at 800 units/hr.  H/H are stable. Platelets are low at 133 (low on admission at 140). No bleeding noted. SCr 0.88 with estimated CrCl ~ 44.6 mL/min.   Goal of Therapy:  Monitor platelets by anticoagulation protocol: Yes   Plan:  Start Xarelto 15 mg po BID with meals x 21 days, then reduce to 20 mg po daily with supper.  Stop IV Heparin at the same time you give the first Xarelto  dose at 1700 PM.  Will discontinue Heparin levels.  Monitor CBC and for signs and symptoms of bleeding.   Sloan Leiter, PharmD, BCPS, BCCCP Clinical Pharmacist Clinical phone 06/03/2017 until 3:30PM - #11PM After hours, please call [phone] 06/03/2017,1:03 PM

## 2017-06-03 NOTE — Progress Notes (Signed)
ANTICOAGULATION CONSULT NOTE - Follow Up Consult  Pharmacy Consult:  Heparin Indication: pulmonary embolus  No Known Allergies  Patient Measurements: Height: 5\' 7"  (170.2 cm) Weight: 153 lb 14.1 oz (69.8 kg) IBW/kg (Calculated) : 61.6 Heparin Dosing Weight: 69 kg  Vital Signs: Temp: 97.7 F (36.5 C) (03/21 0345) Temp Source: Oral (03/21 0345) BP: 166/64 (03/21 0600) Pulse Rate: 81 (03/21 0700)  Labs: Recent Labs    05/31/17 1547  06/01/17 2244 06/02/17 0445 06/02/17 1143 06/03/17 0514  HGB 14.4   < > 13.9 12.5  --  14.3  HCT 44.8   < > 41.2 39.0  --  43.7  PLT 155   < > 159 146*  --  133*  LABPROT 14.2  --   --   --   --   --   INR 1.11  --   --   --   --   --   HEPARINUNFRC  --    < > 0.53 0.48  --  0.40  CREATININE 1.29*  --  0.89 0.88  --   --   TROPONINI 0.44*  --  0.28* 0.20* 0.13*  --    < > = values in this interval not displayed.    Estimated Creatinine Clearance: 44.6 mL/min (by C-G formula based on SCr of 0.88 mg/dL).   Assessment: 60 YOF with PE to continue on IV heparin.    Heparin level remains therapeutic and no bleeding reported.  CBC is within normal limits.    Goal of Therapy:  Heparin level 0.3-0.7 units/ml Monitor platelets by anticoagulation protocol: Yes    Plan:  Continue heparin infusion at 800 units/hr Daily heparin level and CBC Follow-up with transitioning to oral [facility]  Consider checking a free T4 level  Sloan Leiter, PharmD, BCPS, BCCCP Clinical Pharmacist Clinical phone 06/03/2017 until 3:30PM - #[phone] After hours, please call [phone] 06/03/2017, 7:55 AM

## 2017-06-04 LAB — CBC
HCT: 40.6 % (ref 36.0–46.0)
Hemoglobin: 13.4 g/dL (ref 12.0–15.0)
MCH: 28.3 pg (ref 26.0–34.0)
MCHC: 33 g/dL (ref 30.0–36.0)
MCV: 85.8 fL (ref 78.0–100.0)
PLATELETS: 160 10*3/uL (ref 150–400)
RBC: 4.73 MIL/uL (ref 3.87–5.11)
RDW: 14.3 % (ref 11.5–15.5)
WBC: 9.1 10*3/uL (ref 4.0–10.5)

## 2017-06-04 LAB — TYPE AND SCREEN
ABO/RH(D): A POS
Antibody Screen: NEGATIVE
UNIT DIVISION: 0
Unit division: 0

## 2017-06-04 LAB — BPAM RBC
BLOOD PRODUCT EXPIRATION DATE: 201904092359
Blood Product Expiration Date: 201904092359
UNIT TYPE AND RH: 6200
Unit Type and Rh: 5100

## 2017-06-04 MED ORDER — AMLODIPINE BESYLATE 5 MG PO TABS
5.0000 mg | ORAL_TABLET | Freq: Every day | ORAL | Status: DC
  Administered 2017-06-04: 5 mg via ORAL
  Filled 2017-06-04: qty 1

## 2017-06-04 NOTE — Progress Notes (Signed)
Physical Therapy Treatment Patient Details Name: Tammy Mosley MRN: [id number] DOB: Sep 14, 1928 Today's Date: 06/04/2017    History of Present Illness 84 y.o. female admitted on 05/31/17 dx with saddle PE.  Pt with significant PMH of dementia, severe glaucoma, cataracts, frequent PVCs, and insomnia.    PT Comments    Pt performed increased activity and less confused with son present.  Pt required decreased assistance and able to progress activity during session.  She remains to require assistance and will continue to benefit from skilled rehab at SNF to improve strength and function.     Follow Up Recommendations  SNF;Supervision/Assistance - 24 hour(Family would like for patient to go to the same facility her husband is at in [facility] with [facility].  )     Equipment Recommendations  None recommended by PT    Recommendations for Other Services       Precautions / Restrictions Precautions Precautions: Fall;Other (comment) Precaution Comments: very poor vision at baseline as well as dementia Restrictions Weight Bearing Restrictions: No    Mobility  Bed Mobility Overal bed mobility: Needs Assistance Bed Mobility: Supine to Sit;Sit to Supine     Supine to sit: Min assist Sit to supine: Mod assist   General bed mobility comments: Pt required min assist to elevate trunk into sitting.  Pt required increased assistance to lower trunk and lift B LEs into bed against gravity.    Transfers Overall transfer level: Needs assistance Equipment used: Rolling walker (2 wheeled) Transfers: Sit to/from Stand Sit to Stand: Min assist;Mod assist         General transfer comment: Pt requires increased time but able to stand with decreased assistance. Pt performed additional trial from bed and after fatigue she does require moderate assistance.    Ambulation/Gait Ambulation/Gait assistance: Min assist Ambulation Distance (Feet): 30 Feet Assistive device: Rolling walker  (2 wheeled) Gait Pattern/deviations: Step-through pattern;Trunk flexed;Decreased stride length;Shuffle;Wide base of support   Gait velocity interpretation: Below normal speed for age/gender General Gait Details: Cues for increased activity and to advance gait distance.  Pt required decreased assistance but following commands better.  Pt is performing shuffling wide based pattern.     Stairs            Wheelchair Mobility    Modified Rankin (Stroke Patients Only)       Balance     Sitting balance-Leahy Scale: Fair       Standing balance-Leahy Scale: Poor                              Cognition Arousal/Alertness: Awake/alert Behavior During Therapy: Restless;Anxious Overall Cognitive Status: History of cognitive impairments - at baseline                                 General Comments: Dementia at baseline.        Exercises      General Comments        Pertinent Vitals/Pain Pain Assessment: 0-10 Pain Score: 4  Pain Location: Reports L side or torso Pain Descriptors / Indicators: Grimacing;Guarding Pain Intervention(s): Monitored during session;Repositioned    Home Living Family/patient expects to be discharged to:: Skilled nursing facility Living Arrangements: Children                  Prior Function  PT Goals (current goals can now be found in the care plan section) Acute Rehab PT Goals Patient Stated Goal: son is interested in SNF with her husband for rehab Potential to Achieve Goals: Good Progress towards PT goals: Progressing toward goals    Frequency    Min 3X/week      PT Plan Current plan remains appropriate    Co-evaluation              AM-PAC PT "6 Clicks" Daily Activity  Outcome Measure  Difficulty turning over in bed (including adjusting bedclothes, sheets and blankets)?: Unable Difficulty moving from lying on back to sitting on the side of the bed? : Unable Difficulty  sitting down on and standing up from a chair with arms (e.g., wheelchair, bedside commode, etc,.)?: Unable Help needed moving to and from a bed to chair (including a wheelchair)?: A Lot Help needed walking in hospital room?: A Little Help needed climbing 3-5 steps with a railing? : A Lot 6 Click Score: 10    End of Session Equipment Utilized During Treatment: Gait belt Activity Tolerance: Patient limited by fatigue(less anxious during activity.  ) Patient left: in bed;with call bell/phone within reach;with family/visitor present(son at bed side.  ) Nurse Communication: Mobility status PT Visit Diagnosis: Muscle weakness (generalized) (M62.81);Difficulty in walking, not elsewhere classified (R26.2)     Time: 3009-2330 PT Time Calculation (min) (ACUTE ONLY): 27 min  Charges:  $Gait Training: 8-22 mins $Therapeutic Activity: 8-22 mins                    G Codes:       Governor Rooks, PTA pager [phone]    Cristela Blue 06/04/2017, 11:05 AM

## 2017-06-04 NOTE — Progress Notes (Signed)
RN unable to complete pt admission hx d/t pt having advanced dementia. RN will complete when pt's son arrives.

## 2017-06-04 NOTE — NC FL2 (Signed)
[place] MEDICAID FL2 LEVEL OF CARE SCREENING TOOL     IDENTIFICATION  Patient Name: Tammy Mosley Birthdate: [birth year] Sex: female Admission Date (Current Location): 05/31/2017  [facility] and [place] Number:  [employer] and Address:  [facility], [place] [address] [place], [place] [address]      Provider Number: [id number]  Attending Physician Name and Address:  Modena Jansky, MD  Relative Name and Phone Number:       Current Level of Care: Hospital Recommended Level of Care: [place] Prior Approval Number:    Date Approved/Denied:   PASRR Number: Manual review  Discharge Plan: SNF    Current Diagnoses: Patient Active Problem List   Diagnosis Date Noted  . Pulmonary embolism ([place]) 06/01/2017  . Pulmonary embolus ([place]) 05/31/2017  . Metabolic encephalopathy 65/46/5035  . Hypokalemia 02/21/2016  . Alzheimer's disease 10/11/2013  . Colles' fracture of right radius 02/02/2013  . Lump or mass in breast 01/05/2013  . Short-term memory loss 12/20/2012  . Asthma with acute exacerbation 07/15/2012  . Hypothyroidism 06/20/2012  . Other and unspecified hyperlipidemia 06/20/2012  . Constipation 06/22/2010  . RECTAL BLEEDING 08/23/2009  . COLONIC POLYPS, HX OF 08/23/2009    Orientation RESPIRATION BLADDER Height & Weight     Self  O2(Nasal Canula 2 L) Incontinent, Indwelling catheter Weight: 149 lb (67.6 kg) Height:  5' 7"  (170.2 cm)  BEHAVIORAL SYMPTOMS/MOOD NEUROLOGICAL BOWEL NUTRITION STATUS  (None) (Alzheimer's) Continent Diet(Regular)  AMBULATORY STATUS COMMUNICATION OF NEEDS Skin   Limited Assist Verbally Skin abrasions, Bruising                       Personal Care Assistance Level of Assistance              Functional Limitations Info  Sight, Hearing, Speech Sight Info: Adequate Hearing Info: Adequate Speech Info: Adequate    SPECIAL CARE FACTORS FREQUENCY  PT (By licensed PT)     PT  Frequency: 5 x week              Contractures Contractures Info: Not present    Additional Factors Info  Code Status, Allergies, Isolation Precautions Code Status Info: DNR Allergies Info: NKDA     Isolation Precautions Info: Droplet and Contact precautions: MRSA. Has been on Tamiflu since 3/20.     Current Medications (06/04/2017):  This is the current hospital active medication list Current Facility-Administered Medications  Medication Dose Route Frequency Provider Last Rate Last Dose  . 0.9 %  sodium chloride infusion  250 mL Intravenous PRN Corey Harold, NP      . acetaminophen (TYLENOL) tablet 650 mg  650 mg Oral Q4H PRN Schorr, Rhetta Mura, NP   650 mg at 06/03/17 2351  . albuterol (PROVENTIL) (2.5 MG/3ML) 0.083% nebulizer solution 2.5 mg  2.5 mg Nebulization Q2H PRN Hongalgi, Anand D, MD      . Chlorhexidine Gluconate Cloth 2 % PADS 6 each  6 each Topical Q0600 Hongalgi, Anand D, MD      . dextromethorphan-guaiFENesin (MUCINEX DM) 30-600 MG per 12 hr tablet 1 tablet  1 tablet Oral BID Modena Jansky, MD   1 tablet at 06/04/17 0841  . docusate sodium (COLACE) capsule 100 mg  100 mg Oral BID Modena Jansky, MD   100 mg at 06/04/17 0841  . dorzolamide (TRUSOPT) 2 % ophthalmic solution 1 drop  1 drop Both Eyes BID Mesner, Corene Cornea, MD  1 drop at 06/04/17 0850  . haloperidol lactate (HALDOL) injection 1 mg  1 mg Intravenous Q12H PRN Hongalgi, Everlene Farrier D, MD      . hydrALAZINE (APRESOLINE) injection 10 mg  10 mg Intravenous Q8H PRN Hongalgi, Anand D, MD      . levothyroxine (SYNTHROID, LEVOTHROID) tablet 100 mcg  100 mcg Oral QAC breakfast Modena Jansky, MD   100 mcg at 06/04/17 0548  . MEDLINE mouth rinse  15 mL Mouth Rinse BID Hammonds, Sharyn Blitz, MD   15 mL at 06/04/17 0851  . mupirocin ointment (BACTROBAN) 2 % 1 application  1 application Nasal BID Hammonds, Sharyn Blitz, MD   1 application at 67/67/20 [phone]  . oseltamivir (TAMIFLU) capsule 30 mg  30 mg Oral BID Corey Harold, NP   30 mg at 06/04/17 0841  . rivaroxaban [place] Reasons) Education Kit for DVT/PE patients   Does not apply Once Priscella Mann, [place]      . Rivaroxaban (XARELTO) tablet 15 mg  15 mg Oral BID WC Sloan Leiter B, RPH   15 mg at 06/04/17 0840   Followed by  . [START ON 06/24/2017] rivaroxaban (XARELTO) tablet 20 mg  20 mg Oral Q supper Priscella Mann, [facility]         Discharge Medications: Please see discharge summary for a list of discharge medications.  Relevant Imaging Results:  Relevant Lab Results:   Additional Information SS#: [id number]  Candie Chroman, LCSW

## 2017-06-04 NOTE — Clinical Social Work Note (Signed)
Clinical Social Work Assessment  Patient Details  Name: Tammy Mosley MRN: [id number] Date of Birth: 12/10/1928  Date of referral:  06/04/17               Reason for consult:  Facility Placement, Discharge Planning                Permission sought to share information with:  Facility Sport and exercise psychologist, Family Supports Permission granted to share information::  Yes, Verbal Permission Granted  Name::     Bart Rinke  Agency::  [facility]  Relationship::  Son  Contact Information:  [phone]  Housing/Transportation Living arrangements for the past 2 months:  Single Family Home Source of Information:  Patient, Medical Team, Adult Children Patient Interpreter Needed:  None Criminal Activity/Legal Involvement Pertinent to Current Situation/Hospitalization:  No - Comment as needed Significant Relationships:  Adult Children, Spouse Lives with:  Adult Children Do you feel safe going back to the place where you live?  Yes Need for family participation in patient care:  Yes (Comment)  Care giving concerns:  PT recommending SNF once medically stable for discharge.   Social Worker assessment / plan:  Patient not fully oriented. Son at bedside. CSW introduced role and explained that PT recommendations would be discussed. Patient's son agreeable to SNF placement. First preference is [facility] because this is where patient's husband currently is. Admissions coordinator notified. Per MD, patient will likely discharge over the weekend. If SNF can accommodate her needs, they can take patient on Saturday only if discharge summary is in by 11:00 so they can order medications. Admissions coordinator will call CSW back after she reviews the referral. No further concerns. CSW encouraged patient's son to contact CSW as needed. CSW will continue to follow patient and her son for support and facilitate discharge to SNF once medically stable.  Employment status:  Retired Forensic scientist:   Medicare PT Recommendations:  [place] / Referral to community resources:  [place]  Patient/Family's Response to care:  Patient not fully oriented. Patient's son agreeable to SNF placement at [facility]. Patient's son supportive and involved in patient's care. Patient's son appreciated social work intervention.  Patient/Family's Understanding of and Emotional Response to Diagnosis, Current Treatment, and Prognosis:  Patient not fully oriented. Patient's son has a good understanding of the reason for admission and her need for rehab prior to returning home. Patient's son appears happy with hospital care.  Emotional Assessment Appearance:  Appears stated age Attitude/Demeanor/Rapport:  Unable to Assess Affect (typically observed):  Unable to Assess Orientation:  Oriented to Self Alcohol / Substance use:  Never Used Psych involvement (Current and /or in the community):  No (Comment)  Discharge Needs  Concerns to be addressed:  Care Coordination Readmission within the last 30 days:  No Current discharge risk:  Cognitively Impaired, Dependent with Mobility Barriers to Discharge:  Continued Medical Work up   Candie Chroman, LCSW 06/04/2017, 11:47 AM

## 2017-06-04 NOTE — Clinical Social Work Note (Signed)
[facility] SNF can take patient tomorrow as long as they have the summary by 11:00 am so they can order medications. They cannot take her on Sunday. CSW paged MD to notify.  Dayton Scrape, [place]

## 2017-06-04 NOTE — Progress Notes (Signed)
PROGRESS NOTE   Tammy Mosley  YSA:[id number]    DOB: Aug 11, 1928    DOA: 05/31/2017  PCP: Mikey Kirschner, MD   I have briefly reviewed patients previous medical records in [facility].  Brief Narrative:  84 year old female, PMH of severe dementia (at baseline recognizes family members, not oriented to year, intermittently oriented to place), asthma, ovarian cancer status post TAH/BSO and chronic kidney disease who presented to an outside hospital on 3/18 with complaints of fatigue of 2 days duration.  Her whole family had the flu last week and patient was also diagnosed with flu at the OSH ER.  At some point a CTA chest was obtained even though patient did not have any dyspnea and that revealed bilateral PEs with saddle embolus.  She was transferred to [facility] for evaluation for possible TPA.  CCM initially admitted patient and did not see any indications for thrombolytics and recommended continuing IV heparin infusion, Tamiflu for influenza.  Patient transferred to [facility] on 06/02/17.  After discussing with family, transitioned from IV heparin to Xarelto on 3/21.  PT evaluation.   Assessment & Plan:   Active Problems:   Pulmonary embolus (HCC)   Pulmonary embolism (HCC)   Saddle pulmonary embolism/submassive pulmonary embolism: Patient has remained hemodynamically stable without hypotension.  Saturating at 100% on 3 L/min [place] oxygen.  As per PCCM input on admission, not candidate for thrombolytics and recommended continuing IV heparin infusion.  Lower extremity venous Dopplers negative for DVT.  TTE: LVEF 32-35%, grade 1 diastolic dysfunction and RV mildly dilated with mildly reduced systolic function.  I discussed in detail with patient's son on 3/21 including risks and benefits of anticoagulation, options for oral anticoagulation including warfarin, NOAC's (Xarelto & Eliquis) & Pradaxa including risks and benefits of each.  He opted for Xarelto which was started 3/21.  Duration of anticoagulation  at least 6 months and may be longer but will defer to her PCP to decide.  As per son, no prior episodes of VTE and he reports that patient's ovarian cancer is in remission since the 90s.  Elevated troponin: Likely from demand ischemia related to saddle PE.  Troponin trending down.  TTE with normal LVEF.  Influenza A with acute bronchitis: Complete course of Tamiflu.  Chest x-ray 3/21 personally reviewed and no acute findings.  Supportive treatment with bronchodilator nebulizations as needed and cough syrup.  Acute respiratory failure with hypoxia: Secondary to PE and influenza.  Wean oxygen as tolerated for saturations >92%.  Hypothyroid: Continue Synthroid.  TSH 5.361.  May consider increasing dose mildly versus repeating TSH in a few weeks.  Thrombocytopenia: Mild.  Monitor closely while on IV heparin drip.  Resolved.  Advanced dementia with behavioral abnormalities: Delirium precautions.  PRN Haldol if absolutely necessary.  No agitation reported overnight.  Hyperglycemia: No DM history.  Check A1c: 5.6.  Acute urinary retention: Overnight 3/19, needed in and out cath for bladder scan that showed 744 mL residual urine.  Currently has pure wick despite which had ongoing urinary retention, Foley catheter placed and 800 mL emptied 3/21.  As discussed with son, this may be a chronic issue.  May need to consider discharging on Foley catheter versus voiding trial prior to discharge.  Consider voiding trial in a.m. prior to discharge to SNF.  CKD stage 3: stable  Essential hypertension: Start amlodipine 5 mg daily and titrate as needed.  PRN IV hydralazine.     DVT prophylaxis: On full dose IV heparin infusion.  Transition to Xarelto per pharmacy on 3/21 Code Status: DNR Family Communication: Discussed in detail with patient's son on 3/21 Disposition: [facility] 3/23.   Consultants:  CCM-signed off 3/19  Procedures:  Foley catheter 3/21 >.  Antimicrobials:   Tamiflu   Subjective: Poor historian.  Denied complaints.  No pain, dyspnea or cough reported this morning.  Only oriented to self.  ROS: As above  Objective:  Vitals:   06/03/17 2109 06/04/17 0031 06/04/17 0700 06/04/17 1229  BP: (!) 171/96 (!) 162/90 (!) 180/100   Pulse: 97 (!) 103 (!) 105 97  Resp: 18 18 18 18   Temp: 98 F (36.7 C) 97.8 F (36.6 C) 98.7 F (37.1 C) (!) 97.3 F (36.3 C)  TempSrc: Oral Oral Oral Oral  SpO2: 95% 94% 94%   Weight:   67.6 kg (149 lb)   Height:        Examination:  General exam: Pleasant elderly female, moderately built and nourished, lying comfortably propped up in bed.  Does not appear in any distress. Respiratory system: Slightly harsh breath sounds bilaterally with scattered occasional expiratory rhonchi but no crackles.  No increased work of breathing.  Able without change. Cardiovascular system: S1 & S2 heard, RRR. No JVD, murmurs, rubs, gallops or clicks. No pedal edema.  Telemetry personally reviewed: Sinus rhythm-ST in the 100s. Gastrointestinal system: Abdomen is nondistended, soft and nontender. No organomegaly or masses felt. Normal bowel sounds heard.  Stable without change. Central nervous system: Alert and oriented only to self.  Follow simple instructions. No focal neurological deficits. Extremities: Symmetrically moves all limbs with seemingly normal power. Skin: No rashes, lesions or ulcers Psychiatry: Judgement and insight impaired. Mood & affect pleasant and appropriate today.     Data Reviewed: I have personally reviewed following labs and imaging studies  CBC: Recent Labs  Lab 05/31/17 1547 06/01/17 0214 06/01/17 2244 06/02/17 0445 06/03/17 0514 06/04/17 0524  WBC 8.3 5.3 9.5 6.9 5.6 9.1  NEUTROABS 6.7  --  7.5  --   --   --   HGB 14.4 13.2 13.9 12.5 14.3 13.4  HCT 44.8 41.1 41.2 39.0 43.7 40.6  MCV 88.9 88.0 86.2 87.8 87.1 85.8  PLT 155 140* 159 146* 133* 595   Basic Metabolic Panel: Recent Labs  Lab  05/31/17 1547 06/01/17 2244 06/02/17 0445  NA 134* 139 139  K 4.5 3.7 3.8  CL 101 107 108  CO2 21* 20* 22  GLUCOSE 216* 145* 124*  BUN 29* 15 16  CREATININE 1.29* 0.89 0.88  CALCIUM 8.4* 8.3* 8.1*  MG  --   --  1.9  PHOS  --   --  3.1   Liver Function Tests: Recent Labs  Lab 05/31/17 1547 06/01/17 2244  AST 38 36  ALT 23 20  ALKPHOS 81 70  BILITOT 0.9 0.9  PROT 7.1 6.5  ALBUMIN 3.5 3.4*   Coagulation Profile: Recent Labs  Lab 05/31/17 1547  INR 1.11   Cardiac Enzymes: Recent Labs  Lab 05/31/17 1547 06/01/17 2244 06/02/17 0445 06/02/17 1143  TROPONINI 0.44* 0.28* 0.20* 0.13*   HbA1C: Recent Labs    06/02/17 1744  HGBA1C 5.6   CBG: Recent Labs  Lab 06/01/17 2350 06/02/17 0347 06/02/17 0726 06/02/17 1225 06/02/17 2019  GLUCAP 153* 140* 115* 93 96    Recent Results (from the past 240 hour(s))  MRSA PCR Screening     Status: Abnormal   Collection Time: 06/01/17  9:23 PM  Result Value  Ref Range Status   MRSA by PCR POSITIVE (A) NEGATIVE Final    Comment:        The GeneXpert MRSA Assay (FDA approved for NASAL specimens only), is one component of a comprehensive MRSA colonization surveillance program. It is not intended to diagnose MRSA infection nor to guide or monitor treatment for MRSA infections. RESULT CALLED TO, READ BACK BY AND VERIFIED WITH: B CUMMINGS RN 06/02/17 0202 JDW Performed at [facility], [place] [address] [place], [place] [address]          Radiology Studies: Dg Chest Port 1 View  Result Date: 06/03/2017 CLINICAL DATA:  Flu, cough, congestion EXAM: PORTABLE CHEST 1 VIEW COMPARISON:  CT chest 05/31/2017 FINDINGS: There is no focal parenchymal opacity. There is no pleural effusion or pneumothorax. There is stable cardiomegaly. There is thoracic aortic atherosclerosis. There is moderate osteoarthritis of bilateral glenohumeral joints. IMPRESSION: No active disease. Electronically Signed   By: Kathreen Devoid   On:  06/03/2017 10:00        Scheduled Meds: . Chlorhexidine Gluconate Cloth  6 each Topical Q0600  . dextromethorphan-guaiFENesin  1 tablet Oral BID  . docusate sodium  100 mg Oral BID  . dorzolamide  1 drop Both Eyes BID  . levothyroxine  100 mcg Oral QAC breakfast  . mouth rinse  15 mL Mouth Rinse BID  . mupirocin ointment  1 application Nasal BID  . oseltamivir  30 mg Oral BID  . rivaroxaban   Does not apply Once  . rivaroxaban  15 mg Oral BID WC   Followed by  . [START ON 06/24/2017] rivaroxaban  20 mg Oral Q supper   Continuous Infusions: . sodium chloride       LOS: 4 days     Vernell Leep, MD, FACP, [facility]. [facility] Pager [phone]  If 7PM-7AM, please contact night-coverage [email or web address] Password [facility] 06/04/2017, 4:07 PM

## 2017-06-04 NOTE — Care Management Important Message (Signed)
Important Message  Patient Details  Name: Tammy Mosley MRN: [id number] Date of Birth: [birth year]   Medicare Important Message Given:  Yes    Carles Collet, RN 06/04/2017, 2:44 PM

## 2017-06-04 NOTE — Progress Notes (Signed)
RN rounded on pt. Pt states she does not need anything at this time. Pt's son is at bedside.

## 2017-06-04 NOTE — Progress Notes (Signed)
Pt c/o lower abdominal pain. Last BM unknown as pt has advanced dementia, hx of constipation, and no BMs charted. MD paged. Orders received for Dulcolax 10mg  PO x1 and Colace 100mg  PO BID. Will continue to monitor.

## 2017-06-04 NOTE — Clinical Social Work Note (Addendum)
Requested documents uploaded to [place] [facility] for PASARR review.  Dayton Scrape, CSW [phone]  4:23 pm PASARR obtained: [id number].  Dayton Scrape, [place]

## 2017-06-05 DIAGNOSIS — R338 Other retention of urine: Secondary | ICD-10-CM | POA: Diagnosis not present

## 2017-06-05 DIAGNOSIS — J45998 Other asthma: Secondary | ICD-10-CM | POA: Diagnosis not present

## 2017-06-05 DIAGNOSIS — R350 Frequency of micturition: Secondary | ICD-10-CM | POA: Diagnosis not present

## 2017-06-05 DIAGNOSIS — R339 Retention of urine, unspecified: Secondary | ICD-10-CM | POA: Diagnosis not present

## 2017-06-05 DIAGNOSIS — R41841 Cognitive communication deficit: Secondary | ICD-10-CM | POA: Diagnosis not present

## 2017-06-05 DIAGNOSIS — J111 Influenza due to unidentified influenza virus with other respiratory manifestations: Secondary | ICD-10-CM | POA: Diagnosis not present

## 2017-06-05 DIAGNOSIS — F0281 Dementia in other diseases classified elsewhere with behavioral disturbance: Secondary | ICD-10-CM | POA: Diagnosis not present

## 2017-06-05 DIAGNOSIS — E039 Hypothyroidism, unspecified: Secondary | ICD-10-CM | POA: Diagnosis not present

## 2017-06-05 DIAGNOSIS — I129 Hypertensive chronic kidney disease with stage 1 through stage 4 chronic kidney disease, or unspecified chronic kidney disease: Secondary | ICD-10-CM | POA: Diagnosis not present

## 2017-06-05 DIAGNOSIS — R0902 Hypoxemia: Secondary | ICD-10-CM | POA: Diagnosis not present

## 2017-06-05 DIAGNOSIS — R1312 Dysphagia, oropharyngeal phase: Secondary | ICD-10-CM | POA: Diagnosis not present

## 2017-06-05 DIAGNOSIS — R4182 Altered mental status, unspecified: Secondary | ICD-10-CM | POA: Diagnosis not present

## 2017-06-05 DIAGNOSIS — I2699 Other pulmonary embolism without acute cor pulmonale: Secondary | ICD-10-CM | POA: Diagnosis not present

## 2017-06-05 DIAGNOSIS — R2689 Other abnormalities of gait and mobility: Secondary | ICD-10-CM | POA: Diagnosis not present

## 2017-06-05 DIAGNOSIS — G309 Alzheimer's disease, unspecified: Secondary | ICD-10-CM | POA: Diagnosis not present

## 2017-06-05 DIAGNOSIS — J9601 Acute respiratory failure with hypoxia: Secondary | ICD-10-CM | POA: Diagnosis not present

## 2017-06-05 DIAGNOSIS — M6281 Muscle weakness (generalized): Secondary | ICD-10-CM | POA: Diagnosis not present

## 2017-06-05 DIAGNOSIS — J80 Acute respiratory distress syndrome: Secondary | ICD-10-CM | POA: Diagnosis not present

## 2017-06-05 DIAGNOSIS — I2692 Saddle embolus of pulmonary artery without acute cor pulmonale: Secondary | ICD-10-CM | POA: Diagnosis not present

## 2017-06-05 DIAGNOSIS — F0280 Dementia in other diseases classified elsewhere without behavioral disturbance: Secondary | ICD-10-CM | POA: Diagnosis not present

## 2017-06-05 DIAGNOSIS — R739 Hyperglycemia, unspecified: Secondary | ICD-10-CM | POA: Diagnosis not present

## 2017-06-05 DIAGNOSIS — H409 Unspecified glaucoma: Secondary | ICD-10-CM | POA: Diagnosis not present

## 2017-06-05 DIAGNOSIS — J101 Influenza due to other identified influenza virus with other respiratory manifestations: Secondary | ICD-10-CM | POA: Diagnosis not present

## 2017-06-05 DIAGNOSIS — N183 Chronic kidney disease, stage 3 (moderate): Secondary | ICD-10-CM | POA: Diagnosis not present

## 2017-06-05 LAB — CBC
HCT: 39.5 % (ref 36.0–46.0)
HEMOGLOBIN: 13.4 g/dL (ref 12.0–15.0)
MCH: 29.1 pg (ref 26.0–34.0)
MCHC: 33.9 g/dL (ref 30.0–36.0)
MCV: 85.7 fL (ref 78.0–100.0)
Platelets: 169 10*3/uL (ref 150–400)
RBC: 4.61 MIL/uL (ref 3.87–5.11)
RDW: 14.3 % (ref 11.5–15.5)
WBC: 7.3 10*3/uL (ref 4.0–10.5)

## 2017-06-05 MED ORDER — OSELTAMIVIR PHOSPHATE 30 MG PO CAPS: 30.0000 mg | ORAL_CAPSULE | Freq: Two times a day (BID) | ORAL | Status: DC

## 2017-06-05 MED ORDER — DM-GUAIFENESIN ER 30-600 MG PO TB12: 1.0000 | ORAL_TABLET | Freq: Two times a day (BID) | ORAL | Status: AC | PRN

## 2017-06-05 MED ORDER — ALBUTEROL SULFATE (2.5 MG/3ML) 0.083% IN NEBU: 2.5000 mg | INHALATION_SOLUTION | Freq: Four times a day (QID) | RESPIRATORY_TRACT | Status: AC | PRN

## 2017-06-05 MED ORDER — AMLODIPINE BESYLATE 10 MG PO TABS
10.0000 mg | ORAL_TABLET | Freq: Every day | ORAL | Status: DC
  Administered 2017-06-05: 10 mg via ORAL
  Filled 2017-06-05: qty 1

## 2017-06-05 MED ORDER — AMLODIPINE BESYLATE 10 MG PO TABS: 10.0000 mg | ORAL_TABLET | Freq: Every day | ORAL | Status: AC

## 2017-06-05 MED ORDER — RIVAROXABAN (XARELTO) VTE STARTER PACK (15 & 20 MG): ORAL_TABLET | ORAL | 0 refills | Status: AC

## 2017-06-05 MED ORDER — OSELTAMIVIR PHOSPHATE 30 MG PO CAPS: 30.0000 mg | ORAL_CAPSULE | Freq: Two times a day (BID) | ORAL | 0 refills | Status: AC

## 2017-06-05 MED ORDER — DOCUSATE SODIUM 100 MG PO CAPS: 100.0000 mg | ORAL_CAPSULE | Freq: Two times a day (BID) | ORAL | Status: AC

## 2017-06-05 NOTE — Progress Notes (Signed)
Bladder scan performed, max reading of 43ml. Pt has not voided since removal of foley.

## 2017-06-05 NOTE — Progress Notes (Signed)
Page to Dr Algis Liming.  3e10 [place]. Please call re foley status and facility request for Tamiflu rx

## 2017-06-05 NOTE — Clinical Social Work Placement (Signed)
   CLINICAL SOCIAL WORK PLACEMENT  NOTE  Date:  06/05/2017  Patient Details  Name: Tammy Mosley MRN: [id number] Date of Birth: [birth year]  Clinical Social Work is seeking post-discharge placement for this patient at the [place] level of care (*CSW will initial, date and re-position this form in  chart as items are completed):  Yes   Patient/family provided with [place] Work Department's list of facilities offering this level of care within the geographic area requested by the patient (or if unable, by the patient's family).  Yes   Patient/family informed of their freedom to choose among providers that offer the needed level of care, that participate in Medicare, Medicaid or managed care program needed by the patient, have an available bed and are willing to accept the patient.  Yes   Patient/family informed of [facility]'s ownership interest in [facility] and [facility], as well as of the fact that they are under no obligation to receive care at these facilities.  PASRR submitted to EDS on 06/04/17     PASRR number received on 06/04/17     Existing PASRR number confirmed on       FL2 transmitted to all facilities in geographic area requested by pt/family on 06/04/17     FL2 transmitted to all facilities within larger geographic area on       Patient informed that his/her managed care company has contracts with or will negotiate with certain facilities, including the following:        Yes   Patient/family informed of bed offers received.  Patient chooses bed at [facility]     Physician recommends and patient chooses bed at      Patient to be transferred to [facility] called [facility] on 06/05/17.  Patient to be transferred to facility by [facility]     Patient family notified on 06/05/17 of transfer.  Name of family member notified:  Bart Gerbino     PHYSICIAN        Additional Comment:    _______________________________________________ Candie Chroman, LCSW 06/05/2017, 2:19 PM

## 2017-06-05 NOTE — Plan of Care (Signed)
Pt pending probably discharge to facility today.

## 2017-06-05 NOTE — Progress Notes (Signed)
Patient's family is at bedside, updated on plan to go to SNF today.

## 2017-06-05 NOTE — Progress Notes (Signed)
Patient resting comfortably during shift report. Denies complaints.  

## 2017-06-05 NOTE — Progress Notes (Signed)
Brandy from SNF stated the pharmacy at the the facility cannot obtain tamiflu due to back order  Called the pharmacy, spoke to Lelon Frohlich, she stated to call MD and request printed prescription for 2 doses and pharmacy can fill prior to discharge  Primary RN aware  Paged MD, awaiting call back

## 2017-06-05 NOTE — Clinical Social Work Note (Signed)
CSW facilitated patient discharge including contacting patient family and facility to confirm patient discharge plans. Clinical information faxed to facility and family agreeable with plan. CSW arranged ambulance transport via [facility] to [facility]. RN has already called report to the receiving RN.  CSW will sign off for now as social work intervention is no longer needed. Please consult [place] again if new needs arise.  [place] Scrape, [place]

## 2017-06-05 NOTE — Progress Notes (Signed)
Call placed to CCMD to notify of telemetry monitoring d/c.   

## 2017-06-05 NOTE — Clinical Social Work Note (Signed)
Discharge summary sent to facility. SNF RN aware. Per MD, waiting to see if patient will void to determine if she will need to discharge with a foley or not. CSW will follow up with RN throughout the day.  Day[place] Scrape, [place]

## 2017-06-05 NOTE — Discharge Summary (Addendum)
Physician Discharge Summary  Tammy Mosley:[id number] DOB: 19-Dec-1928  PCP: Mikey Kirschner, MD  Admit date: 05/31/2017 Discharge date: 06/05/2017  Recommendations for Outpatient Follow-up:  1. MD at SNF in 3 days with repeat labs (CBC & BMP). 2. Dr. Baltazar Apo, PCP upon discharge from SNF. 3. Recommend repeating TSH in 4 weeks. 4. Need for Foley catheter: Please see discussion below.  Home Health: N/A.  Patient being discharged to the same SNF where her spouse resides. Equipment/Devices: N/A    Discharge Condition: Improved and stable. CODE STATUS: DNR. Diet recommendation: Heart healthy diet.  Discharge Diagnoses:  Active Problems:   Pulmonary embolus (HCC)   Pulmonary embolism [facility])   Brief Summary: 84 year old female, PMH of severe dementia (at baseline recognizes family members, not oriented to year, intermittently oriented to place), asthma, ovarian cancer status post TAH/BSO and chronic kidney disease who presented to an outside hospital on 3/18 with complaints of fatigue of 2 days duration.  Her whole family had the flu last week and patient was also diagnosed with flu at the OSH ER.  At some point a CTA chest was obtained even though patient did not have any dyspnea and that revealed bilateral PEs with saddle embolus.  She was transferred to [facility] for evaluation for possible TPA.  CCM initially admitted patient and did not see any indications for thrombolytics and recommended continuing IV heparin infusion, Tamiflu for influenza.  Patient transferred to [facility] on 06/02/17.  After discussing with family, transitioned from IV heparin to Xarelto on 3/21.     Assessment & Plan:  Saddle pulmonary embolism/submassive pulmonary embolism: Patient has remained hemodynamically stable without hypotension.  Saturating at 94% on 2 L/min [place] oxygen.  As per PCCM input on admission, not candidate for thrombolytics and recommended continuing IV heparin infusion.  Lower extremity venous  Dopplers negative for DVT.  TTE: LVEF 56-38%, grade 1 diastolic dysfunction and RV mildly dilated with mildly reduced systolic function.  I discussed in detail with patient's son on 3/21 including risks and benefits of anticoagulation, options for oral anticoagulation including warfarin, NOAC's (Xarelto & Eliquis) & Pradaxa including risks and benefits of each.  He opted for Xarelto which was started 3/21.  Duration of anticoagulation at least 6 months and may be longer but will defer to her PCP to decide during outpatient follow-up.  As per son, no prior episodes of VTE and he reports that patient's ovarian cancer is in remission since the 90s.  Etiology of PE not known.?  Related sedentary lifestyle.  Will defer decision regarding evaluation for cause to outpatient PCP follow-up.  Elevated troponin: Likely from demand ischemia related to saddle PE.  Troponin trending down.  TTE with normal LVEF.  Influenza A with acute bronchitis: Complete course of Tamiflu.  Chest x-ray 3/21 personally reviewed and no acute findings.  Supportive treatment with bronchodilator nebulizations as needed and cough syrup.  Improved.  Acute respiratory failure with hypoxia: Secondary to PE and influenza.  Wean oxygen as tolerated for saturations >92%.  Hypothyroid: Continue Synthroid.  TSH 5.361.    Clinically euthyroid.  Recommend repeating TSH in 4 weeks and consider adjusting dose if still elevated.  Thrombocytopenia: Mild.  Resolved.  Advanced dementia with behavioral abnormalities: Delirium precautions.  No agitation for the last couple of days.  Hyperglycemia: No DM history.  Check A1c: 5.6.  Acute urinary retention: Overnight 3/19, needed in and out cath for bladder scan that showed 744 mL residual urine.    Again developed  urinary retention and needed Foley catheter placed and 800 mL emptied 3/21.  As discussed with son, this may be a chronic issue.    Discontinued Foley catheter this morning.  Discussed  with nursing at length, check for voiding and postvoid bladder scan for retention.  If no retention then she may go to nursing facility without Foley catheter.  However if she has persistent urinary retention, will place back Foley catheter and discharge to SNF with Foley catheter with recommendation for outpatient Urology follow-up in 1-2 weeks to further evaluate and manage as deemed necessary.  CKD stage 3: stable  Essential hypertension: Started and increased amlodipine to 10 mg daily.  Monitor closely at SNF and may need further medication adjustments or additions.   Consultants:  CCM-signed off 3/19  Procedures:  Foley catheter 3/21 >3/23. TTE 06/02/17: Study Conclusions  - Left ventricle: The cavity size was normal. Wall thickness was   normal. Systolic function was normal. The estimated ejection   fraction was in the range of 60% to 65%. Wall motion was normal;   there were no regional wall motion abnormalities. Doppler   parameters are consistent with abnormal left ventricular   relaxation (grade 1 diastolic dysfunction). - Aortic valve: There was mild regurgitation. - Right ventricle: The cavity size was mildly dilated. Systolic   function was mildly reduced. - Right atrium: The atrium was mildly dilated. - Pulmonary arteries: Systolic pressure was mildly increased. PA   peak pressure: 37 mm Hg (S).    Discharge Instructions  Discharge Instructions    Call MD for:  difficulty breathing, headache or visual disturbances   Complete by:  As directed    Call MD for:  extreme fatigue   Complete by:  As directed    Call MD for:  persistant dizziness or light-headedness   Complete by:  As directed    Call MD for:  temperature >100.4   Complete by:  As directed    Diet - low sodium heart healthy   Complete by:  As directed    Increase activity slowly   Complete by:  As directed        Medication List    TAKE these medications   albuterol (2.5 MG/3ML) 0.083%  nebulizer solution Commonly known as:  PROVENTIL Take 3 mLs (2.5 mg total) by nebulization every 6 (six) hours as needed for wheezing or shortness of breath.   amLODipine 10 MG tablet Commonly known as:  NORVASC Take 1 tablet (10 mg total) by mouth daily.   dextromethorphan-guaiFENesin 30-600 MG 12hr tablet Commonly known as:  MUCINEX DM Take 1 tablet by mouth 2 (two) times daily as needed for cough.   docusate sodium 100 MG capsule Commonly known as:  COLACE Take 1 capsule (100 mg total) by mouth 2 (two) times daily.   dorzolamide 2 % ophthalmic solution Commonly known as:  TRUSOPT Place 1 drop into both eyes 2 (two) times daily.   ketoconazole 2 % cream Commonly known as:  NIZORAL Apply 1 application topically 2 (two) times daily. To underarms only   levothyroxine 100 MCG tablet Commonly known as:  SYNTHROID, LEVOTHROID Take 1 tablet (100 mcg total) by mouth daily.   oseltamivir 30 MG capsule Commonly known as:  TAMIFLU Take 1 capsule (30 mg total) by mouth 2 (two) times daily. Discontinue after 06/05/17 doses.   Rivaroxaban 15 & 20 MG Tbpk Take as directed on package: Start with one 15mg  tablet by mouth twice a day with food. On  Day 22, switch to one 20mg  tablet once a day with food.  Note to pharmacy: Please discard first 4 tablets from the pack which she has received in the hospital.      Follow-up Information    Luking, Grace Bushy, MD. Schedule an appointment as soon as possible for a visit.   Specialty:  Family Medicine Why:  Upon discharge from SNF. Contact information: [address] [place] [phone]        MD at SNF. Schedule an appointment as soon as possible for a visit in 3 day(s).   Why:  To be seen with repeat labs (CBC & BMP).         No Known Allergies    Procedures/Studies: Ct Angio Chest Pe W And/or Wo Contrast  Result Date: 05/31/2017 CLINICAL DATA:  Anemia, hemoglobin 5.5 g, weakness, history asthma, former  smoker, remote history of ovarian cancer EXAM: CT ANGIOGRAPHY CHEST WITH CONTRAST TECHNIQUE: Multidetector CT imaging of the chest was performed using the standard protocol during bolus administration of intravenous contrast. Multiplanar CT image reconstructions and MIPs were obtained to evaluate the vascular anatomy. CONTRAST:  27mL ISOVUE-370 IOPAMIDOL (ISOVUE-370) INJECTION 76% IV COMPARISON:  None FINDINGS: Cardiovascular: Atherosclerotic calcifications aorta and coronary arteries. Aorta normal caliber. Pulmonary arteries well opacified. Large filling defects identified in the central pulmonary arteries bilaterally compatible with pulmonary embolism. These include a large saddle emboli at the bifurcations of the LEFT and RIGHT pulmonary arteries as well as a large thrombus within the proximal RIGHT pulmonary artery extending to the main pulmonary artery bifurcation. Emboli extend into all pulmonary lobes bilaterally greatest into RIGHT lower lobe. Dilatation of RIGHT atrium and RIGHT ventricle with an elevated RV/LV ratio = 1.81 consistent with RIGHT heart strain. No pericardial effusion. Mediastinum/Nodes: Esophagus unremarkable. Base of cervical region normal appearance. No thoracic adenopathy. Lungs/Pleura: Minimal atelectasis at LEFT lower lobe. Lungs otherwise clear. Upper Abdomen: Unremarkable Musculoskeletal: Pectus excavatum. No acute osseous findings. Bones demineralized. Review of the MIP images confirms the above findings. IMPRESSION: Multiple pulmonary emboli including saddle emboli at the bifurcations of the RIGHT and LEFT pulmonary arteries, with emboli seen extending into all lobes. Positive for acute PE with CT evidence of right heart strain (RV/LV Ratio = 1.81) consistent with at least submassive (intermediate risk) PE. The presence of right heart strain has been associated with an increased risk of morbidity and mortality. Please activate Code PE by paging [phone]. Scattered  atherosclerotic calcifications of aorta and coronary arteries. Aortic Atherosclerosis (ICD10-I70.0). Critical Value/emergent results were called by telephone at the time of interpretation on 05/31/2017 at 6:11 pm to Dr. Merrily Pew , who verbally acknowledged these results. Electronically Signed   By: Lavonia Dana M.D.   On: 05/31/2017 18:12   Dg Chest Port 1 View  Result Date: 06/03/2017 CLINICAL DATA:  Flu, cough, congestion EXAM: PORTABLE CHEST 1 VIEW COMPARISON:  CT chest 05/31/2017 FINDINGS: There is no focal parenchymal opacity. There is no pleural effusion or pneumothorax. There is stable cardiomegaly. There is thoracic aortic atherosclerosis. There is moderate osteoarthritis of bilateral glenohumeral joints. IMPRESSION: No active disease. Electronically Signed   By: Kathreen Devoid   On: 06/03/2017 10:00   Dg Chest Portable 1 View  Result Date: 05/31/2017 CLINICAL DATA:  Hypoxia, weakness, history asthma, former smoker EXAM: PORTABLE CHEST 1 VIEW COMPARISON:  Portable exam 1540 hours compared to 02/21/2016 FINDINGS: Enlargement of cardiac silhouette with pulmonary vascular congestion. Atherosclerotic calcification aorta. Mediastinal contours normal. Lungs clear.  No acute infiltrate, pleural effusion or pneumothorax. Osseous demineralization with BILATERAL chronic rotator cuff tears. IMPRESSION: Enlargement of cardiac silhouette with pulmonary vascular congestion. No acute abnormalities. Electronically Signed   By: Lavonia Dana M.D.   On: 05/31/2017 15:58      Subjective: "I want to go home".  Denies complaints.  No dyspnea or chest pain reported.  As per RN, no acute issues reported.  Foley catheter removed earlier this morning and waiting for patient to void.  Discharge Exam:  Vitals:   06/04/17 1229 06/04/17 1719 06/04/17 1959 06/05/17 0447  BP:  (!) 165/91 (!) 168/90 (!) 143/92  Pulse: 97 98 100 90  Resp: 18  18 18   Temp: (!) 97.3 F (36.3 C)  (!) 97.3 F (36.3 C) 98.2 F (36.8 C)   TempSrc: Oral  Oral Oral  SpO2:   96% 94%  Weight:    69.4 kg (153 lb 1.6 oz)  Height:        General exam: Pleasant elderly female, moderately built and nourished, lying comfortably propped up in bed.  Does not appear in any distress. Respiratory system: Clear to auscultation.  No increased work of breathing. Cardiovascular system: S1 & S2 heard, RRR. No JVD, murmurs, rubs, gallops or clicks. No pedal edema.   Gastrointestinal system: Abdomen is nondistended, soft and nontender. No organomegaly or masses felt. Normal bowel sounds heard.  Central nervous system: Alert and oriented only to self.  Follows simple instructions. No focal neurological deficits. Extremities: Symmetrically moves all limbs with seemingly normal power. Skin: No rashes, lesions or ulcers Psychiatry: Judgement and insight impaired. Mood & affect slightly anxious.      The results of significant diagnostics from this hospitalization (including imaging, microbiology, ancillary and laboratory) are listed below for reference.     Microbiology: Recent Results (from the past 240 hour(s))  MRSA PCR Screening     Status: Abnormal   Collection Time: 06/01/17  9:23 PM  Result Value Ref Range Status   MRSA by PCR POSITIVE (A) NEGATIVE Final    Comment:        The GeneXpert MRSA Assay (FDA approved for NASAL specimens only), is one component of a comprehensive MRSA colonization surveillance program. It is not intended to diagnose MRSA infection nor to guide or monitor treatment for MRSA infections. RESULT CALLED TO, READ BACK BY AND VERIFIED WITH: B CUMMINGS RN 06/02/17 0202 JDW Performed at [facility], [place] [address], [place], [place] [address]      Labs: CBC: Recent Labs  Lab 05/31/17 1547  06/01/17 2244 06/02/17 0445 06/03/17 0514 06/04/17 0524 06/05/17 0327  WBC 8.3   < > 9.5 6.9 5.6 9.1 7.3  NEUTROABS 6.7  --  7.5  --   --   --   --   HGB 14.4   < > 13.9 12.5 14.3 13.4 13.4  HCT 44.8   <  > 41.2 39.0 43.7 40.6 39.5  MCV 88.9   < > 86.2 87.8 87.1 85.8 85.7  PLT 155   < > 159 146* 133* 160 169   < > = values in this interval not displayed.   Basic Metabolic Panel: Recent Labs  Lab 05/31/17 1547 06/01/17 2244 06/02/17 0445  NA 134* 139 139  K 4.5 3.7 3.8  CL 101 107 108  CO2 21* 20* 22  GLUCOSE 216* 145* 124*  BUN 29* 15 16  CREATININE 1.29* 0.89 0.88  CALCIUM 8.4* 8.3* 8.1*  MG  --   --  1.9  PHOS  --   --  3.1   Liver Function Tests: Recent Labs  Lab 05/31/17 1547 06/01/17 2244  AST 38 36  ALT 23 20  ALKPHOS 81 70  BILITOT 0.9 0.9  PROT 7.1 6.5  ALBUMIN 3.5 3.4*   BNP (last 3 results) Recent Labs    05/31/17 1547 06/01/17 2244  BNP 458.0* 674.1*   Cardiac Enzymes: Recent Labs  Lab 05/31/17 1547 06/01/17 2244 06/02/17 0445 06/02/17 1143  TROPONINI 0.44* 0.28* 0.20* 0.13*   CBG: Recent Labs  Lab 06/01/17 2350 06/02/17 0347 06/02/17 0726 06/02/17 1225 06/02/17 2019  GLUCAP 153* 140* 115* 93 96   Hgb A1c Recent Labs    06/02/17 1744  HGBA1C 5.6   Urinalysis    Component Value Date/Time   COLORURINE YELLOW 06/01/2017 0720   APPEARANCEUR CLEAR 06/01/2017 0720   LABSPEC 1.044 (H) 06/01/2017 0720   PHURINE 5.0 06/01/2017 0720   GLUCOSEU NEGATIVE 06/01/2017 0720   HGBUR MODERATE (A) 06/01/2017 0720   BILIRUBINUR NEGATIVE 06/01/2017 0720   KETONESUR 5 (A) 06/01/2017 0720   PROTEINUR 30 (A) 06/01/2017 0720   NITRITE NEGATIVE 06/01/2017 0720   LEUKOCYTESUR TRACE (A) 06/01/2017 0720      Time coordinating discharge: Over 30 minutes  SIGNED:  Vernell Leep, MD, FACP, [facility]. [facility] Pager [phone]  If 7PM-7AM, please contact night-coverage [email or web address] Password [facility] 06/05/2017, 10:29 AM

## 2017-06-05 NOTE — Progress Notes (Signed)
Called report spoke to Moro at [facility]

## 2017-06-05 NOTE — Progress Notes (Signed)
Called facility regarding pt has not voided since foley removal  Brandy stated facility does not have a protocol but prefers pt void within 8 hours  Pt foley removed at 8 am  Pt bladder scan completed per MD order , pt not retaining  Pt having poor intake  MD aware, MD stated okay to discharge pt and to inform that if pt does not void within 8 hours pt will need foley at facility  Informed Brandy at [facility] tamiflu prescription. Awaiting dose  Social work aware, set up transportation  [place]

## 2017-06-09 DIAGNOSIS — R350 Frequency of micturition: Secondary | ICD-10-CM | POA: Diagnosis not present

## 2017-06-09 DIAGNOSIS — R338 Other retention of urine: Secondary | ICD-10-CM | POA: Diagnosis not present

## 2017-07-01 DIAGNOSIS — R338 Other retention of urine: Secondary | ICD-10-CM | POA: Diagnosis not present

## 2017-07-13 DIAGNOSIS — J45998 Other asthma: Secondary | ICD-10-CM | POA: Diagnosis not present

## 2017-07-13 DIAGNOSIS — N183 Chronic kidney disease, stage 3 (moderate): Secondary | ICD-10-CM | POA: Diagnosis not present

## 2017-07-13 DIAGNOSIS — F0280 Dementia in other diseases classified elsewhere without behavioral disturbance: Secondary | ICD-10-CM | POA: Diagnosis not present

## 2017-07-13 DIAGNOSIS — I2699 Other pulmonary embolism without acute cor pulmonale: Secondary | ICD-10-CM | POA: Diagnosis not present

## 2017-07-27 DIAGNOSIS — R338 Other retention of urine: Secondary | ICD-10-CM | POA: Diagnosis not present

## 2017-07-27 DIAGNOSIS — N139 Obstructive and reflux uropathy, unspecified: Secondary | ICD-10-CM | POA: Diagnosis not present

## 2017-07-28 DIAGNOSIS — K625 Hemorrhage of anus and rectum: Secondary | ICD-10-CM | POA: Diagnosis not present

## 2017-07-29 DIAGNOSIS — Z5181 Encounter for therapeutic drug level monitoring: Secondary | ICD-10-CM | POA: Diagnosis not present

## 2017-07-29 DIAGNOSIS — Z7982 Long term (current) use of aspirin: Secondary | ICD-10-CM | POA: Diagnosis not present

## 2017-07-29 DIAGNOSIS — Z79899 Other long term (current) drug therapy: Secondary | ICD-10-CM | POA: Diagnosis not present

## 2017-08-17 DIAGNOSIS — G309 Alzheimer's disease, unspecified: Secondary | ICD-10-CM | POA: Diagnosis not present

## 2017-08-17 DIAGNOSIS — Z7982 Long term (current) use of aspirin: Secondary | ICD-10-CM | POA: Diagnosis not present

## 2017-08-17 DIAGNOSIS — Z8543 Personal history of malignant neoplasm of ovary: Secondary | ICD-10-CM | POA: Diagnosis not present

## 2017-08-17 DIAGNOSIS — Z993 Dependence on wheelchair: Secondary | ICD-10-CM | POA: Diagnosis not present

## 2017-08-17 DIAGNOSIS — H547 Unspecified visual loss: Secondary | ICD-10-CM | POA: Diagnosis not present

## 2017-08-17 DIAGNOSIS — Z86711 Personal history of pulmonary embolism: Secondary | ICD-10-CM | POA: Diagnosis not present

## 2017-08-17 DIAGNOSIS — F0280 Dementia in other diseases classified elsewhere without behavioral disturbance: Secondary | ICD-10-CM | POA: Diagnosis not present

## 2017-08-17 DIAGNOSIS — K625 Hemorrhage of anus and rectum: Secondary | ICD-10-CM | POA: Diagnosis not present

## 2017-08-17 DIAGNOSIS — I10 Essential (primary) hypertension: Secondary | ICD-10-CM | POA: Diagnosis not present

## 2017-08-17 DIAGNOSIS — E039 Hypothyroidism, unspecified: Secondary | ICD-10-CM | POA: Diagnosis not present

## 2017-08-17 DIAGNOSIS — Z79899 Other long term (current) drug therapy: Secondary | ICD-10-CM | POA: Diagnosis not present

## 2017-08-17 DIAGNOSIS — K921 Melena: Secondary | ICD-10-CM | POA: Diagnosis not present

## 2017-08-27 DIAGNOSIS — N183 Chronic kidney disease, stage 3 (moderate): Secondary | ICD-10-CM | POA: Diagnosis not present

## 2017-08-27 DIAGNOSIS — I2699 Other pulmonary embolism without acute cor pulmonale: Secondary | ICD-10-CM | POA: Diagnosis not present

## 2017-08-27 DIAGNOSIS — J45998 Other asthma: Secondary | ICD-10-CM | POA: Diagnosis not present

## 2017-08-27 DIAGNOSIS — F0280 Dementia in other diseases classified elsewhere without behavioral disturbance: Secondary | ICD-10-CM | POA: Diagnosis not present

## 2017-10-02 DIAGNOSIS — F0280 Dementia in other diseases classified elsewhere without behavioral disturbance: Secondary | ICD-10-CM | POA: Diagnosis not present

## 2017-10-02 DIAGNOSIS — I2699 Other pulmonary embolism without acute cor pulmonale: Secondary | ICD-10-CM | POA: Diagnosis not present

## 2017-10-02 DIAGNOSIS — J45998 Other asthma: Secondary | ICD-10-CM | POA: Diagnosis not present

## 2017-10-02 DIAGNOSIS — N183 Chronic kidney disease, stage 3 (moderate): Secondary | ICD-10-CM | POA: Diagnosis not present

## 2017-10-04 DIAGNOSIS — M6281 Muscle weakness (generalized): Secondary | ICD-10-CM | POA: Diagnosis not present

## 2017-10-04 DIAGNOSIS — R2689 Other abnormalities of gait and mobility: Secondary | ICD-10-CM | POA: Diagnosis not present

## 2017-10-05 DIAGNOSIS — R262 Difficulty in walking, not elsewhere classified: Secondary | ICD-10-CM | POA: Diagnosis not present

## 2017-10-05 DIAGNOSIS — L603 Nail dystrophy: Secondary | ICD-10-CM | POA: Diagnosis not present

## 2017-10-05 DIAGNOSIS — R2689 Other abnormalities of gait and mobility: Secondary | ICD-10-CM | POA: Diagnosis not present

## 2017-10-05 DIAGNOSIS — L84 Corns and callosities: Secondary | ICD-10-CM | POA: Diagnosis not present

## 2017-10-05 DIAGNOSIS — I739 Peripheral vascular disease, unspecified: Secondary | ICD-10-CM | POA: Diagnosis not present

## 2017-10-05 DIAGNOSIS — M6281 Muscle weakness (generalized): Secondary | ICD-10-CM | POA: Diagnosis not present

## 2017-10-06 DIAGNOSIS — R2689 Other abnormalities of gait and mobility: Secondary | ICD-10-CM | POA: Diagnosis not present

## 2017-10-06 DIAGNOSIS — M6281 Muscle weakness (generalized): Secondary | ICD-10-CM | POA: Diagnosis not present

## 2017-10-07 DIAGNOSIS — R2689 Other abnormalities of gait and mobility: Secondary | ICD-10-CM | POA: Diagnosis not present

## 2017-10-07 DIAGNOSIS — M6281 Muscle weakness (generalized): Secondary | ICD-10-CM | POA: Diagnosis not present

## 2017-10-08 DIAGNOSIS — R2689 Other abnormalities of gait and mobility: Secondary | ICD-10-CM | POA: Diagnosis not present

## 2017-10-08 DIAGNOSIS — M6281 Muscle weakness (generalized): Secondary | ICD-10-CM | POA: Diagnosis not present

## 2017-10-11 DIAGNOSIS — R2689 Other abnormalities of gait and mobility: Secondary | ICD-10-CM | POA: Diagnosis not present

## 2017-10-11 DIAGNOSIS — M6281 Muscle weakness (generalized): Secondary | ICD-10-CM | POA: Diagnosis not present

## 2017-10-12 DIAGNOSIS — M6281 Muscle weakness (generalized): Secondary | ICD-10-CM | POA: Diagnosis not present

## 2017-10-12 DIAGNOSIS — R2689 Other abnormalities of gait and mobility: Secondary | ICD-10-CM | POA: Diagnosis not present

## 2017-10-13 DIAGNOSIS — M6281 Muscle weakness (generalized): Secondary | ICD-10-CM | POA: Diagnosis not present

## 2017-10-13 DIAGNOSIS — R2689 Other abnormalities of gait and mobility: Secondary | ICD-10-CM | POA: Diagnosis not present

## 2017-10-14 DIAGNOSIS — R2689 Other abnormalities of gait and mobility: Secondary | ICD-10-CM | POA: Diagnosis not present

## 2017-10-14 DIAGNOSIS — Z5181 Encounter for therapeutic drug level monitoring: Secondary | ICD-10-CM | POA: Diagnosis not present

## 2017-10-14 DIAGNOSIS — M6281 Muscle weakness (generalized): Secondary | ICD-10-CM | POA: Diagnosis not present

## 2017-10-14 DIAGNOSIS — Z79899 Other long term (current) drug therapy: Secondary | ICD-10-CM | POA: Diagnosis not present

## 2017-10-15 DIAGNOSIS — R2689 Other abnormalities of gait and mobility: Secondary | ICD-10-CM | POA: Diagnosis not present

## 2017-10-15 DIAGNOSIS — M6281 Muscle weakness (generalized): Secondary | ICD-10-CM | POA: Diagnosis not present

## 2017-10-16 DIAGNOSIS — R2689 Other abnormalities of gait and mobility: Secondary | ICD-10-CM | POA: Diagnosis not present

## 2017-10-16 DIAGNOSIS — M6281 Muscle weakness (generalized): Secondary | ICD-10-CM | POA: Diagnosis not present

## 2017-10-18 DIAGNOSIS — L02818 Cutaneous abscess of other sites: Secondary | ICD-10-CM | POA: Diagnosis not present

## 2017-10-18 DIAGNOSIS — R2689 Other abnormalities of gait and mobility: Secondary | ICD-10-CM | POA: Diagnosis not present

## 2017-10-18 DIAGNOSIS — M6281 Muscle weakness (generalized): Secondary | ICD-10-CM | POA: Diagnosis not present

## 2017-10-19 DIAGNOSIS — R2689 Other abnormalities of gait and mobility: Secondary | ICD-10-CM | POA: Diagnosis not present

## 2017-10-19 DIAGNOSIS — M6281 Muscle weakness (generalized): Secondary | ICD-10-CM | POA: Diagnosis not present

## 2017-10-20 DIAGNOSIS — M6281 Muscle weakness (generalized): Secondary | ICD-10-CM | POA: Diagnosis not present

## 2017-10-20 DIAGNOSIS — R2689 Other abnormalities of gait and mobility: Secondary | ICD-10-CM | POA: Diagnosis not present

## 2017-10-21 DIAGNOSIS — M6281 Muscle weakness (generalized): Secondary | ICD-10-CM | POA: Diagnosis not present

## 2017-10-21 DIAGNOSIS — R2689 Other abnormalities of gait and mobility: Secondary | ICD-10-CM | POA: Diagnosis not present

## 2017-10-22 DIAGNOSIS — R2689 Other abnormalities of gait and mobility: Secondary | ICD-10-CM | POA: Diagnosis not present

## 2017-10-22 DIAGNOSIS — M6281 Muscle weakness (generalized): Secondary | ICD-10-CM | POA: Diagnosis not present

## 2017-10-25 DIAGNOSIS — R2689 Other abnormalities of gait and mobility: Secondary | ICD-10-CM | POA: Diagnosis not present

## 2017-10-25 DIAGNOSIS — M6281 Muscle weakness (generalized): Secondary | ICD-10-CM | POA: Diagnosis not present

## 2017-10-26 DIAGNOSIS — R2689 Other abnormalities of gait and mobility: Secondary | ICD-10-CM | POA: Diagnosis not present

## 2017-10-26 DIAGNOSIS — M6281 Muscle weakness (generalized): Secondary | ICD-10-CM | POA: Diagnosis not present

## 2017-10-27 DIAGNOSIS — M6281 Muscle weakness (generalized): Secondary | ICD-10-CM | POA: Diagnosis not present

## 2017-10-27 DIAGNOSIS — R2689 Other abnormalities of gait and mobility: Secondary | ICD-10-CM | POA: Diagnosis not present

## 2017-10-28 DIAGNOSIS — R2689 Other abnormalities of gait and mobility: Secondary | ICD-10-CM | POA: Diagnosis not present

## 2017-10-28 DIAGNOSIS — M6281 Muscle weakness (generalized): Secondary | ICD-10-CM | POA: Diagnosis not present

## 2017-10-29 DIAGNOSIS — R2689 Other abnormalities of gait and mobility: Secondary | ICD-10-CM | POA: Diagnosis not present

## 2017-10-29 DIAGNOSIS — M6281 Muscle weakness (generalized): Secondary | ICD-10-CM | POA: Diagnosis not present

## 2017-11-01 DIAGNOSIS — M6281 Muscle weakness (generalized): Secondary | ICD-10-CM | POA: Diagnosis not present

## 2017-11-01 DIAGNOSIS — R2689 Other abnormalities of gait and mobility: Secondary | ICD-10-CM | POA: Diagnosis not present

## 2017-11-02 DIAGNOSIS — R2689 Other abnormalities of gait and mobility: Secondary | ICD-10-CM | POA: Diagnosis not present

## 2017-11-02 DIAGNOSIS — M6281 Muscle weakness (generalized): Secondary | ICD-10-CM | POA: Diagnosis not present

## 2017-11-03 ENCOUNTER — Ambulatory Visit: Payer: Medicare Other | Admitting: Family Medicine

## 2017-11-03 DIAGNOSIS — M6281 Muscle weakness (generalized): Secondary | ICD-10-CM | POA: Diagnosis not present

## 2017-11-03 DIAGNOSIS — R2689 Other abnormalities of gait and mobility: Secondary | ICD-10-CM | POA: Diagnosis not present

## 2017-11-04 DIAGNOSIS — M6281 Muscle weakness (generalized): Secondary | ICD-10-CM | POA: Diagnosis not present

## 2017-11-04 DIAGNOSIS — R2689 Other abnormalities of gait and mobility: Secondary | ICD-10-CM | POA: Diagnosis not present

## 2017-11-05 DIAGNOSIS — R2689 Other abnormalities of gait and mobility: Secondary | ICD-10-CM | POA: Diagnosis not present

## 2017-11-05 DIAGNOSIS — M6281 Muscle weakness (generalized): Secondary | ICD-10-CM | POA: Diagnosis not present

## 2017-11-08 DIAGNOSIS — R2689 Other abnormalities of gait and mobility: Secondary | ICD-10-CM | POA: Diagnosis not present

## 2017-11-08 DIAGNOSIS — M6281 Muscle weakness (generalized): Secondary | ICD-10-CM | POA: Diagnosis not present

## 2017-11-09 DIAGNOSIS — M6281 Muscle weakness (generalized): Secondary | ICD-10-CM | POA: Diagnosis not present

## 2017-11-09 DIAGNOSIS — R2689 Other abnormalities of gait and mobility: Secondary | ICD-10-CM | POA: Diagnosis not present

## 2017-11-10 DIAGNOSIS — M6281 Muscle weakness (generalized): Secondary | ICD-10-CM | POA: Diagnosis not present

## 2017-11-10 DIAGNOSIS — H401134 Primary open-angle glaucoma, bilateral, indeterminate stage: Secondary | ICD-10-CM | POA: Diagnosis not present

## 2017-11-10 DIAGNOSIS — R2689 Other abnormalities of gait and mobility: Secondary | ICD-10-CM | POA: Diagnosis not present

## 2017-11-10 DIAGNOSIS — Z961 Presence of intraocular lens: Secondary | ICD-10-CM | POA: Diagnosis not present

## 2017-11-11 DIAGNOSIS — R2689 Other abnormalities of gait and mobility: Secondary | ICD-10-CM | POA: Diagnosis not present

## 2017-11-11 DIAGNOSIS — M6281 Muscle weakness (generalized): Secondary | ICD-10-CM | POA: Diagnosis not present

## 2017-11-12 DIAGNOSIS — R2689 Other abnormalities of gait and mobility: Secondary | ICD-10-CM | POA: Diagnosis not present

## 2017-11-12 DIAGNOSIS — M6281 Muscle weakness (generalized): Secondary | ICD-10-CM | POA: Diagnosis not present

## 2017-11-15 DIAGNOSIS — R1319 Other dysphagia: Secondary | ICD-10-CM | POA: Diagnosis not present

## 2017-11-15 DIAGNOSIS — R1310 Dysphagia, unspecified: Secondary | ICD-10-CM | POA: Diagnosis not present

## 2017-11-15 DIAGNOSIS — M6281 Muscle weakness (generalized): Secondary | ICD-10-CM | POA: Diagnosis not present

## 2017-11-15 DIAGNOSIS — R2689 Other abnormalities of gait and mobility: Secondary | ICD-10-CM | POA: Diagnosis not present

## 2017-11-16 DIAGNOSIS — R2689 Other abnormalities of gait and mobility: Secondary | ICD-10-CM | POA: Diagnosis not present

## 2017-11-16 DIAGNOSIS — R1310 Dysphagia, unspecified: Secondary | ICD-10-CM | POA: Diagnosis not present

## 2017-11-16 DIAGNOSIS — M6281 Muscle weakness (generalized): Secondary | ICD-10-CM | POA: Diagnosis not present

## 2017-11-16 DIAGNOSIS — R1319 Other dysphagia: Secondary | ICD-10-CM | POA: Diagnosis not present

## 2017-11-17 DIAGNOSIS — M6281 Muscle weakness (generalized): Secondary | ICD-10-CM | POA: Diagnosis not present

## 2017-11-17 DIAGNOSIS — R1319 Other dysphagia: Secondary | ICD-10-CM | POA: Diagnosis not present

## 2017-11-17 DIAGNOSIS — R2689 Other abnormalities of gait and mobility: Secondary | ICD-10-CM | POA: Diagnosis not present

## 2017-11-17 DIAGNOSIS — R1310 Dysphagia, unspecified: Secondary | ICD-10-CM | POA: Diagnosis not present

## 2017-11-18 DIAGNOSIS — R1310 Dysphagia, unspecified: Secondary | ICD-10-CM | POA: Diagnosis not present

## 2017-11-18 DIAGNOSIS — D098 Carcinoma in situ of other specified sites: Secondary | ICD-10-CM | POA: Diagnosis not present

## 2017-11-18 DIAGNOSIS — R2689 Other abnormalities of gait and mobility: Secondary | ICD-10-CM | POA: Diagnosis not present

## 2017-11-18 DIAGNOSIS — R1319 Other dysphagia: Secondary | ICD-10-CM | POA: Diagnosis not present

## 2017-11-18 DIAGNOSIS — M6281 Muscle weakness (generalized): Secondary | ICD-10-CM | POA: Diagnosis not present

## 2017-11-18 DIAGNOSIS — E038 Other specified hypothyroidism: Secondary | ICD-10-CM | POA: Diagnosis not present

## 2017-11-18 DIAGNOSIS — H42 Glaucoma in diseases classified elsewhere: Secondary | ICD-10-CM | POA: Diagnosis not present

## 2017-11-19 DIAGNOSIS — M6281 Muscle weakness (generalized): Secondary | ICD-10-CM | POA: Diagnosis not present

## 2017-11-19 DIAGNOSIS — R2689 Other abnormalities of gait and mobility: Secondary | ICD-10-CM | POA: Diagnosis not present

## 2017-11-19 DIAGNOSIS — R1319 Other dysphagia: Secondary | ICD-10-CM | POA: Diagnosis not present

## 2017-11-19 DIAGNOSIS — R1310 Dysphagia, unspecified: Secondary | ICD-10-CM | POA: Diagnosis not present

## 2017-11-22 DIAGNOSIS — R1319 Other dysphagia: Secondary | ICD-10-CM | POA: Diagnosis not present

## 2017-11-22 DIAGNOSIS — R2689 Other abnormalities of gait and mobility: Secondary | ICD-10-CM | POA: Diagnosis not present

## 2017-11-22 DIAGNOSIS — R1310 Dysphagia, unspecified: Secondary | ICD-10-CM | POA: Diagnosis not present

## 2017-11-22 DIAGNOSIS — M6281 Muscle weakness (generalized): Secondary | ICD-10-CM | POA: Diagnosis not present

## 2017-11-23 DIAGNOSIS — M6281 Muscle weakness (generalized): Secondary | ICD-10-CM | POA: Diagnosis not present

## 2017-11-23 DIAGNOSIS — R1319 Other dysphagia: Secondary | ICD-10-CM | POA: Diagnosis not present

## 2017-11-23 DIAGNOSIS — R2689 Other abnormalities of gait and mobility: Secondary | ICD-10-CM | POA: Diagnosis not present

## 2017-11-23 DIAGNOSIS — R1310 Dysphagia, unspecified: Secondary | ICD-10-CM | POA: Diagnosis not present

## 2017-11-24 DIAGNOSIS — R1310 Dysphagia, unspecified: Secondary | ICD-10-CM | POA: Diagnosis not present

## 2017-11-24 DIAGNOSIS — M6281 Muscle weakness (generalized): Secondary | ICD-10-CM | POA: Diagnosis not present

## 2017-11-24 DIAGNOSIS — R1319 Other dysphagia: Secondary | ICD-10-CM | POA: Diagnosis not present

## 2017-11-24 DIAGNOSIS — R2689 Other abnormalities of gait and mobility: Secondary | ICD-10-CM | POA: Diagnosis not present

## 2017-11-25 DIAGNOSIS — M6281 Muscle weakness (generalized): Secondary | ICD-10-CM | POA: Diagnosis not present

## 2017-11-25 DIAGNOSIS — R1310 Dysphagia, unspecified: Secondary | ICD-10-CM | POA: Diagnosis not present

## 2017-11-25 DIAGNOSIS — R1319 Other dysphagia: Secondary | ICD-10-CM | POA: Diagnosis not present

## 2017-11-25 DIAGNOSIS — R2689 Other abnormalities of gait and mobility: Secondary | ICD-10-CM | POA: Diagnosis not present

## 2017-11-26 DIAGNOSIS — M6281 Muscle weakness (generalized): Secondary | ICD-10-CM | POA: Diagnosis not present

## 2017-11-26 DIAGNOSIS — R1319 Other dysphagia: Secondary | ICD-10-CM | POA: Diagnosis not present

## 2017-11-26 DIAGNOSIS — R2689 Other abnormalities of gait and mobility: Secondary | ICD-10-CM | POA: Diagnosis not present

## 2017-11-26 DIAGNOSIS — R1310 Dysphagia, unspecified: Secondary | ICD-10-CM | POA: Diagnosis not present

## 2017-11-29 DIAGNOSIS — R1310 Dysphagia, unspecified: Secondary | ICD-10-CM | POA: Diagnosis not present

## 2017-11-29 DIAGNOSIS — M6281 Muscle weakness (generalized): Secondary | ICD-10-CM | POA: Diagnosis not present

## 2017-11-29 DIAGNOSIS — R2689 Other abnormalities of gait and mobility: Secondary | ICD-10-CM | POA: Diagnosis not present

## 2017-11-29 DIAGNOSIS — R1319 Other dysphagia: Secondary | ICD-10-CM | POA: Diagnosis not present

## 2017-11-30 DIAGNOSIS — R1319 Other dysphagia: Secondary | ICD-10-CM | POA: Diagnosis not present

## 2017-11-30 DIAGNOSIS — R1310 Dysphagia, unspecified: Secondary | ICD-10-CM | POA: Diagnosis not present

## 2017-11-30 DIAGNOSIS — R2689 Other abnormalities of gait and mobility: Secondary | ICD-10-CM | POA: Diagnosis not present

## 2017-11-30 DIAGNOSIS — M6281 Muscle weakness (generalized): Secondary | ICD-10-CM | POA: Diagnosis not present

## 2017-12-01 DIAGNOSIS — R1319 Other dysphagia: Secondary | ICD-10-CM | POA: Diagnosis not present

## 2017-12-01 DIAGNOSIS — R2689 Other abnormalities of gait and mobility: Secondary | ICD-10-CM | POA: Diagnosis not present

## 2017-12-01 DIAGNOSIS — M6281 Muscle weakness (generalized): Secondary | ICD-10-CM | POA: Diagnosis not present

## 2017-12-01 DIAGNOSIS — R1310 Dysphagia, unspecified: Secondary | ICD-10-CM | POA: Diagnosis not present

## 2017-12-02 DIAGNOSIS — M6281 Muscle weakness (generalized): Secondary | ICD-10-CM | POA: Diagnosis not present

## 2017-12-02 DIAGNOSIS — R2689 Other abnormalities of gait and mobility: Secondary | ICD-10-CM | POA: Diagnosis not present

## 2017-12-02 DIAGNOSIS — R1319 Other dysphagia: Secondary | ICD-10-CM | POA: Diagnosis not present

## 2017-12-02 DIAGNOSIS — R1310 Dysphagia, unspecified: Secondary | ICD-10-CM | POA: Diagnosis not present

## 2017-12-03 DIAGNOSIS — R1310 Dysphagia, unspecified: Secondary | ICD-10-CM | POA: Diagnosis not present

## 2017-12-03 DIAGNOSIS — M6281 Muscle weakness (generalized): Secondary | ICD-10-CM | POA: Diagnosis not present

## 2017-12-03 DIAGNOSIS — R2689 Other abnormalities of gait and mobility: Secondary | ICD-10-CM | POA: Diagnosis not present

## 2017-12-03 DIAGNOSIS — R1319 Other dysphagia: Secondary | ICD-10-CM | POA: Diagnosis not present

## 2017-12-06 DIAGNOSIS — R1319 Other dysphagia: Secondary | ICD-10-CM | POA: Diagnosis not present

## 2017-12-06 DIAGNOSIS — R2689 Other abnormalities of gait and mobility: Secondary | ICD-10-CM | POA: Diagnosis not present

## 2017-12-06 DIAGNOSIS — M6281 Muscle weakness (generalized): Secondary | ICD-10-CM | POA: Diagnosis not present

## 2017-12-06 DIAGNOSIS — R1310 Dysphagia, unspecified: Secondary | ICD-10-CM | POA: Diagnosis not present

## 2017-12-07 DIAGNOSIS — M6281 Muscle weakness (generalized): Secondary | ICD-10-CM | POA: Diagnosis not present

## 2017-12-07 DIAGNOSIS — R1310 Dysphagia, unspecified: Secondary | ICD-10-CM | POA: Diagnosis not present

## 2017-12-07 DIAGNOSIS — R1319 Other dysphagia: Secondary | ICD-10-CM | POA: Diagnosis not present

## 2017-12-07 DIAGNOSIS — R2689 Other abnormalities of gait and mobility: Secondary | ICD-10-CM | POA: Diagnosis not present

## 2017-12-08 DIAGNOSIS — R1319 Other dysphagia: Secondary | ICD-10-CM | POA: Diagnosis not present

## 2017-12-08 DIAGNOSIS — R1310 Dysphagia, unspecified: Secondary | ICD-10-CM | POA: Diagnosis not present

## 2017-12-08 DIAGNOSIS — M6281 Muscle weakness (generalized): Secondary | ICD-10-CM | POA: Diagnosis not present

## 2017-12-08 DIAGNOSIS — R2689 Other abnormalities of gait and mobility: Secondary | ICD-10-CM | POA: Diagnosis not present

## 2017-12-09 DIAGNOSIS — R1310 Dysphagia, unspecified: Secondary | ICD-10-CM | POA: Diagnosis not present

## 2017-12-09 DIAGNOSIS — R2689 Other abnormalities of gait and mobility: Secondary | ICD-10-CM | POA: Diagnosis not present

## 2017-12-09 DIAGNOSIS — M6281 Muscle weakness (generalized): Secondary | ICD-10-CM | POA: Diagnosis not present

## 2017-12-09 DIAGNOSIS — R1319 Other dysphagia: Secondary | ICD-10-CM | POA: Diagnosis not present

## 2017-12-10 DIAGNOSIS — R1319 Other dysphagia: Secondary | ICD-10-CM | POA: Diagnosis not present

## 2017-12-10 DIAGNOSIS — R2689 Other abnormalities of gait and mobility: Secondary | ICD-10-CM | POA: Diagnosis not present

## 2017-12-10 DIAGNOSIS — R1310 Dysphagia, unspecified: Secondary | ICD-10-CM | POA: Diagnosis not present

## 2017-12-10 DIAGNOSIS — M6281 Muscle weakness (generalized): Secondary | ICD-10-CM | POA: Diagnosis not present

## 2017-12-13 DIAGNOSIS — M6281 Muscle weakness (generalized): Secondary | ICD-10-CM | POA: Diagnosis not present

## 2017-12-13 DIAGNOSIS — R2689 Other abnormalities of gait and mobility: Secondary | ICD-10-CM | POA: Diagnosis not present

## 2017-12-13 DIAGNOSIS — R1310 Dysphagia, unspecified: Secondary | ICD-10-CM | POA: Diagnosis not present

## 2017-12-13 DIAGNOSIS — R1319 Other dysphagia: Secondary | ICD-10-CM | POA: Diagnosis not present

## 2017-12-14 DIAGNOSIS — M6281 Muscle weakness (generalized): Secondary | ICD-10-CM | POA: Diagnosis not present

## 2017-12-14 DIAGNOSIS — Z23 Encounter for immunization: Secondary | ICD-10-CM | POA: Diagnosis not present

## 2017-12-14 DIAGNOSIS — R2689 Other abnormalities of gait and mobility: Secondary | ICD-10-CM | POA: Diagnosis not present

## 2017-12-15 DIAGNOSIS — R2689 Other abnormalities of gait and mobility: Secondary | ICD-10-CM | POA: Diagnosis not present

## 2017-12-15 DIAGNOSIS — M6281 Muscle weakness (generalized): Secondary | ICD-10-CM | POA: Diagnosis not present

## 2017-12-16 DIAGNOSIS — R2689 Other abnormalities of gait and mobility: Secondary | ICD-10-CM | POA: Diagnosis not present

## 2017-12-16 DIAGNOSIS — M6281 Muscle weakness (generalized): Secondary | ICD-10-CM | POA: Diagnosis not present

## 2017-12-17 DIAGNOSIS — R2689 Other abnormalities of gait and mobility: Secondary | ICD-10-CM | POA: Diagnosis not present

## 2017-12-17 DIAGNOSIS — M6281 Muscle weakness (generalized): Secondary | ICD-10-CM | POA: Diagnosis not present

## 2017-12-20 DIAGNOSIS — M6281 Muscle weakness (generalized): Secondary | ICD-10-CM | POA: Diagnosis not present

## 2017-12-20 DIAGNOSIS — R2689 Other abnormalities of gait and mobility: Secondary | ICD-10-CM | POA: Diagnosis not present

## 2017-12-21 DIAGNOSIS — M6281 Muscle weakness (generalized): Secondary | ICD-10-CM | POA: Diagnosis not present

## 2017-12-21 DIAGNOSIS — R2689 Other abnormalities of gait and mobility: Secondary | ICD-10-CM | POA: Diagnosis not present

## 2017-12-22 DIAGNOSIS — M6281 Muscle weakness (generalized): Secondary | ICD-10-CM | POA: Diagnosis not present

## 2017-12-22 DIAGNOSIS — R2689 Other abnormalities of gait and mobility: Secondary | ICD-10-CM | POA: Diagnosis not present

## 2017-12-23 DIAGNOSIS — R2689 Other abnormalities of gait and mobility: Secondary | ICD-10-CM | POA: Diagnosis not present

## 2017-12-23 DIAGNOSIS — M6281 Muscle weakness (generalized): Secondary | ICD-10-CM | POA: Diagnosis not present

## 2017-12-24 DIAGNOSIS — R2689 Other abnormalities of gait and mobility: Secondary | ICD-10-CM | POA: Diagnosis not present

## 2017-12-24 DIAGNOSIS — M6281 Muscle weakness (generalized): Secondary | ICD-10-CM | POA: Diagnosis not present

## 2017-12-27 DIAGNOSIS — R2689 Other abnormalities of gait and mobility: Secondary | ICD-10-CM | POA: Diagnosis not present

## 2017-12-27 DIAGNOSIS — M6281 Muscle weakness (generalized): Secondary | ICD-10-CM | POA: Diagnosis not present

## 2017-12-30 DIAGNOSIS — H42 Glaucoma in diseases classified elsewhere: Secondary | ICD-10-CM | POA: Diagnosis not present

## 2017-12-30 DIAGNOSIS — E038 Other specified hypothyroidism: Secondary | ICD-10-CM | POA: Diagnosis not present

## 2017-12-30 DIAGNOSIS — D098 Carcinoma in situ of other specified sites: Secondary | ICD-10-CM | POA: Diagnosis not present

## 2018-01-14 DIAGNOSIS — J45909 Unspecified asthma, uncomplicated: Secondary | ICD-10-CM | POA: Diagnosis not present

## 2018-01-14 DIAGNOSIS — M6281 Muscle weakness (generalized): Secondary | ICD-10-CM | POA: Diagnosis not present

## 2018-01-14 DIAGNOSIS — R739 Hyperglycemia, unspecified: Secondary | ICD-10-CM | POA: Diagnosis not present

## 2018-01-14 DIAGNOSIS — R339 Retention of urine, unspecified: Secondary | ICD-10-CM | POA: Diagnosis not present

## 2018-01-14 DIAGNOSIS — E039 Hypothyroidism, unspecified: Secondary | ICD-10-CM | POA: Diagnosis not present

## 2018-01-14 DIAGNOSIS — I129 Hypertensive chronic kidney disease with stage 1 through stage 4 chronic kidney disease, or unspecified chronic kidney disease: Secondary | ICD-10-CM | POA: Diagnosis not present

## 2018-01-14 DIAGNOSIS — R451 Restlessness and agitation: Secondary | ICD-10-CM | POA: Diagnosis not present

## 2018-01-14 DIAGNOSIS — H409 Unspecified glaucoma: Secondary | ICD-10-CM | POA: Diagnosis not present

## 2018-01-14 DIAGNOSIS — F0281 Dementia in other diseases classified elsewhere with behavioral disturbance: Secondary | ICD-10-CM | POA: Diagnosis not present

## 2018-01-14 DIAGNOSIS — N183 Chronic kidney disease, stage 3 (moderate): Secondary | ICD-10-CM | POA: Diagnosis not present

## 2018-01-14 DIAGNOSIS — Z79899 Other long term (current) drug therapy: Secondary | ICD-10-CM | POA: Diagnosis not present

## 2018-01-14 DIAGNOSIS — R2689 Other abnormalities of gait and mobility: Secondary | ICD-10-CM | POA: Diagnosis not present

## 2018-01-14 DIAGNOSIS — I2699 Other pulmonary embolism without acute cor pulmonale: Secondary | ICD-10-CM | POA: Diagnosis not present

## 2018-01-14 DIAGNOSIS — L02818 Cutaneous abscess of other sites: Secondary | ICD-10-CM | POA: Diagnosis not present

## 2018-01-14 DIAGNOSIS — G309 Alzheimer's disease, unspecified: Secondary | ICD-10-CM | POA: Diagnosis not present

## 2018-01-14 DIAGNOSIS — R41841 Cognitive communication deficit: Secondary | ICD-10-CM | POA: Diagnosis not present

## 2018-01-14 DIAGNOSIS — R1312 Dysphagia, oropharyngeal phase: Secondary | ICD-10-CM | POA: Diagnosis not present

## 2018-02-08 DIAGNOSIS — R262 Difficulty in walking, not elsewhere classified: Secondary | ICD-10-CM | POA: Diagnosis not present

## 2018-02-08 DIAGNOSIS — L603 Nail dystrophy: Secondary | ICD-10-CM | POA: Diagnosis not present

## 2018-02-08 DIAGNOSIS — B351 Tinea unguium: Secondary | ICD-10-CM | POA: Diagnosis not present

## 2018-02-08 DIAGNOSIS — I739 Peripheral vascular disease, unspecified: Secondary | ICD-10-CM | POA: Diagnosis not present

## 2018-02-10 DIAGNOSIS — E038 Other specified hypothyroidism: Secondary | ICD-10-CM | POA: Diagnosis not present

## 2018-02-10 DIAGNOSIS — H42 Glaucoma in diseases classified elsewhere: Secondary | ICD-10-CM | POA: Diagnosis not present

## 2018-02-10 DIAGNOSIS — D098 Carcinoma in situ of other specified sites: Secondary | ICD-10-CM | POA: Diagnosis not present

## 2018-03-26 DIAGNOSIS — E038 Other specified hypothyroidism: Secondary | ICD-10-CM | POA: Diagnosis not present

## 2018-03-26 DIAGNOSIS — D098 Carcinoma in situ of other specified sites: Secondary | ICD-10-CM | POA: Diagnosis not present

## 2018-03-26 DIAGNOSIS — H42 Glaucoma in diseases classified elsewhere: Secondary | ICD-10-CM | POA: Diagnosis not present

## 2018-05-19 DIAGNOSIS — R262 Difficulty in walking, not elsewhere classified: Secondary | ICD-10-CM | POA: Diagnosis not present

## 2018-05-19 DIAGNOSIS — L853 Xerosis cutis: Secondary | ICD-10-CM | POA: Diagnosis not present

## 2018-05-19 DIAGNOSIS — B351 Tinea unguium: Secondary | ICD-10-CM | POA: Diagnosis not present

## 2018-05-19 DIAGNOSIS — I739 Peripheral vascular disease, unspecified: Secondary | ICD-10-CM | POA: Diagnosis not present

## 2018-05-23 DIAGNOSIS — Z961 Presence of intraocular lens: Secondary | ICD-10-CM | POA: Diagnosis not present

## 2018-05-23 DIAGNOSIS — H401134 Primary open-angle glaucoma, bilateral, indeterminate stage: Secondary | ICD-10-CM | POA: Diagnosis not present

## 2018-05-27 DIAGNOSIS — D098 Carcinoma in situ of other specified sites: Secondary | ICD-10-CM | POA: Diagnosis not present

## 2018-05-27 DIAGNOSIS — E038 Other specified hypothyroidism: Secondary | ICD-10-CM | POA: Diagnosis not present

## 2018-05-27 DIAGNOSIS — H42 Glaucoma in diseases classified elsewhere: Secondary | ICD-10-CM | POA: Diagnosis not present

## 2018-06-08 DIAGNOSIS — R2689 Other abnormalities of gait and mobility: Secondary | ICD-10-CM | POA: Diagnosis not present

## 2018-06-08 DIAGNOSIS — N189 Chronic kidney disease, unspecified: Secondary | ICD-10-CM | POA: Diagnosis not present

## 2018-06-08 DIAGNOSIS — M6281 Muscle weakness (generalized): Secondary | ICD-10-CM | POA: Diagnosis not present

## 2018-06-09 DIAGNOSIS — R2689 Other abnormalities of gait and mobility: Secondary | ICD-10-CM | POA: Diagnosis not present

## 2018-06-09 DIAGNOSIS — M6281 Muscle weakness (generalized): Secondary | ICD-10-CM | POA: Diagnosis not present

## 2018-06-09 DIAGNOSIS — N189 Chronic kidney disease, unspecified: Secondary | ICD-10-CM | POA: Diagnosis not present

## 2018-06-10 DIAGNOSIS — R2689 Other abnormalities of gait and mobility: Secondary | ICD-10-CM | POA: Diagnosis not present

## 2018-06-10 DIAGNOSIS — M6281 Muscle weakness (generalized): Secondary | ICD-10-CM | POA: Diagnosis not present

## 2018-06-10 DIAGNOSIS — N189 Chronic kidney disease, unspecified: Secondary | ICD-10-CM | POA: Diagnosis not present

## 2018-06-11 DIAGNOSIS — R2689 Other abnormalities of gait and mobility: Secondary | ICD-10-CM | POA: Diagnosis not present

## 2018-06-11 DIAGNOSIS — N189 Chronic kidney disease, unspecified: Secondary | ICD-10-CM | POA: Diagnosis not present

## 2018-06-11 DIAGNOSIS — M6281 Muscle weakness (generalized): Secondary | ICD-10-CM | POA: Diagnosis not present

## 2018-06-13 DIAGNOSIS — M6281 Muscle weakness (generalized): Secondary | ICD-10-CM | POA: Diagnosis not present

## 2018-06-13 DIAGNOSIS — R2689 Other abnormalities of gait and mobility: Secondary | ICD-10-CM | POA: Diagnosis not present

## 2018-06-13 DIAGNOSIS — N189 Chronic kidney disease, unspecified: Secondary | ICD-10-CM | POA: Diagnosis not present

## 2018-06-14 DIAGNOSIS — M6281 Muscle weakness (generalized): Secondary | ICD-10-CM | POA: Diagnosis not present

## 2018-06-14 DIAGNOSIS — N189 Chronic kidney disease, unspecified: Secondary | ICD-10-CM | POA: Diagnosis not present

## 2018-06-14 DIAGNOSIS — R2689 Other abnormalities of gait and mobility: Secondary | ICD-10-CM | POA: Diagnosis not present

## 2018-06-15 DIAGNOSIS — N189 Chronic kidney disease, unspecified: Secondary | ICD-10-CM | POA: Diagnosis not present

## 2018-06-15 DIAGNOSIS — R2689 Other abnormalities of gait and mobility: Secondary | ICD-10-CM | POA: Diagnosis not present

## 2018-06-15 DIAGNOSIS — M6281 Muscle weakness (generalized): Secondary | ICD-10-CM | POA: Diagnosis not present

## 2018-06-16 DIAGNOSIS — R2689 Other abnormalities of gait and mobility: Secondary | ICD-10-CM | POA: Diagnosis not present

## 2018-06-16 DIAGNOSIS — M6281 Muscle weakness (generalized): Secondary | ICD-10-CM | POA: Diagnosis not present

## 2018-06-16 DIAGNOSIS — N189 Chronic kidney disease, unspecified: Secondary | ICD-10-CM | POA: Diagnosis not present

## 2018-06-17 DIAGNOSIS — M6281 Muscle weakness (generalized): Secondary | ICD-10-CM | POA: Diagnosis not present

## 2018-06-17 DIAGNOSIS — N189 Chronic kidney disease, unspecified: Secondary | ICD-10-CM | POA: Diagnosis not present

## 2018-06-17 DIAGNOSIS — R2689 Other abnormalities of gait and mobility: Secondary | ICD-10-CM | POA: Diagnosis not present

## 2018-06-20 DIAGNOSIS — N189 Chronic kidney disease, unspecified: Secondary | ICD-10-CM | POA: Diagnosis not present

## 2018-06-20 DIAGNOSIS — R2689 Other abnormalities of gait and mobility: Secondary | ICD-10-CM | POA: Diagnosis not present

## 2018-06-20 DIAGNOSIS — M6281 Muscle weakness (generalized): Secondary | ICD-10-CM | POA: Diagnosis not present

## 2018-06-21 DIAGNOSIS — N189 Chronic kidney disease, unspecified: Secondary | ICD-10-CM | POA: Diagnosis not present

## 2018-06-21 DIAGNOSIS — M6281 Muscle weakness (generalized): Secondary | ICD-10-CM | POA: Diagnosis not present

## 2018-06-21 DIAGNOSIS — R2689 Other abnormalities of gait and mobility: Secondary | ICD-10-CM | POA: Diagnosis not present

## 2018-06-22 DIAGNOSIS — M6281 Muscle weakness (generalized): Secondary | ICD-10-CM | POA: Diagnosis not present

## 2018-06-22 DIAGNOSIS — N189 Chronic kidney disease, unspecified: Secondary | ICD-10-CM | POA: Diagnosis not present

## 2018-06-22 DIAGNOSIS — R2689 Other abnormalities of gait and mobility: Secondary | ICD-10-CM | POA: Diagnosis not present

## 2018-06-23 DIAGNOSIS — R2689 Other abnormalities of gait and mobility: Secondary | ICD-10-CM | POA: Diagnosis not present

## 2018-06-23 DIAGNOSIS — M6281 Muscle weakness (generalized): Secondary | ICD-10-CM | POA: Diagnosis not present

## 2018-06-23 DIAGNOSIS — N189 Chronic kidney disease, unspecified: Secondary | ICD-10-CM | POA: Diagnosis not present

## 2018-06-24 DIAGNOSIS — N189 Chronic kidney disease, unspecified: Secondary | ICD-10-CM | POA: Diagnosis not present

## 2018-06-24 DIAGNOSIS — M6281 Muscle weakness (generalized): Secondary | ICD-10-CM | POA: Diagnosis not present

## 2018-06-24 DIAGNOSIS — R2689 Other abnormalities of gait and mobility: Secondary | ICD-10-CM | POA: Diagnosis not present

## 2018-06-26 DIAGNOSIS — H42 Glaucoma in diseases classified elsewhere: Secondary | ICD-10-CM | POA: Diagnosis not present

## 2018-06-26 DIAGNOSIS — E038 Other specified hypothyroidism: Secondary | ICD-10-CM | POA: Diagnosis not present

## 2018-06-26 DIAGNOSIS — D098 Carcinoma in situ of other specified sites: Secondary | ICD-10-CM | POA: Diagnosis not present

## 2018-06-27 DIAGNOSIS — R2689 Other abnormalities of gait and mobility: Secondary | ICD-10-CM | POA: Diagnosis not present

## 2018-06-27 DIAGNOSIS — N189 Chronic kidney disease, unspecified: Secondary | ICD-10-CM | POA: Diagnosis not present

## 2018-06-27 DIAGNOSIS — M6281 Muscle weakness (generalized): Secondary | ICD-10-CM | POA: Diagnosis not present

## 2018-06-28 DIAGNOSIS — M6281 Muscle weakness (generalized): Secondary | ICD-10-CM | POA: Diagnosis not present

## 2018-06-28 DIAGNOSIS — N189 Chronic kidney disease, unspecified: Secondary | ICD-10-CM | POA: Diagnosis not present

## 2018-06-28 DIAGNOSIS — R2689 Other abnormalities of gait and mobility: Secondary | ICD-10-CM | POA: Diagnosis not present

## 2018-06-29 DIAGNOSIS — N189 Chronic kidney disease, unspecified: Secondary | ICD-10-CM | POA: Diagnosis not present

## 2018-06-29 DIAGNOSIS — M6281 Muscle weakness (generalized): Secondary | ICD-10-CM | POA: Diagnosis not present

## 2018-06-29 DIAGNOSIS — R2689 Other abnormalities of gait and mobility: Secondary | ICD-10-CM | POA: Diagnosis not present

## 2018-06-30 DIAGNOSIS — M6281 Muscle weakness (generalized): Secondary | ICD-10-CM | POA: Diagnosis not present

## 2018-06-30 DIAGNOSIS — R2689 Other abnormalities of gait and mobility: Secondary | ICD-10-CM | POA: Diagnosis not present

## 2018-06-30 DIAGNOSIS — N189 Chronic kidney disease, unspecified: Secondary | ICD-10-CM | POA: Diagnosis not present

## 2018-07-01 DIAGNOSIS — R2689 Other abnormalities of gait and mobility: Secondary | ICD-10-CM | POA: Diagnosis not present

## 2018-07-01 DIAGNOSIS — N189 Chronic kidney disease, unspecified: Secondary | ICD-10-CM | POA: Diagnosis not present

## 2018-07-01 DIAGNOSIS — M6281 Muscle weakness (generalized): Secondary | ICD-10-CM | POA: Diagnosis not present

## 2018-07-04 DIAGNOSIS — N189 Chronic kidney disease, unspecified: Secondary | ICD-10-CM | POA: Diagnosis not present

## 2018-07-04 DIAGNOSIS — M6281 Muscle weakness (generalized): Secondary | ICD-10-CM | POA: Diagnosis not present

## 2018-07-04 DIAGNOSIS — R2689 Other abnormalities of gait and mobility: Secondary | ICD-10-CM | POA: Diagnosis not present

## 2018-07-05 DIAGNOSIS — R2689 Other abnormalities of gait and mobility: Secondary | ICD-10-CM | POA: Diagnosis not present

## 2018-07-05 DIAGNOSIS — N189 Chronic kidney disease, unspecified: Secondary | ICD-10-CM | POA: Diagnosis not present

## 2018-07-05 DIAGNOSIS — M6281 Muscle weakness (generalized): Secondary | ICD-10-CM | POA: Diagnosis not present

## 2018-07-06 DIAGNOSIS — N189 Chronic kidney disease, unspecified: Secondary | ICD-10-CM | POA: Diagnosis not present

## 2018-07-06 DIAGNOSIS — M6281 Muscle weakness (generalized): Secondary | ICD-10-CM | POA: Diagnosis not present

## 2018-07-06 DIAGNOSIS — R2689 Other abnormalities of gait and mobility: Secondary | ICD-10-CM | POA: Diagnosis not present

## 2018-07-07 DIAGNOSIS — R2689 Other abnormalities of gait and mobility: Secondary | ICD-10-CM | POA: Diagnosis not present

## 2018-07-07 DIAGNOSIS — M6281 Muscle weakness (generalized): Secondary | ICD-10-CM | POA: Diagnosis not present

## 2018-07-07 DIAGNOSIS — N189 Chronic kidney disease, unspecified: Secondary | ICD-10-CM | POA: Diagnosis not present

## 2018-07-08 DIAGNOSIS — R2689 Other abnormalities of gait and mobility: Secondary | ICD-10-CM | POA: Diagnosis not present

## 2018-07-08 DIAGNOSIS — M6281 Muscle weakness (generalized): Secondary | ICD-10-CM | POA: Diagnosis not present

## 2018-07-08 DIAGNOSIS — N189 Chronic kidney disease, unspecified: Secondary | ICD-10-CM | POA: Diagnosis not present

## 2018-07-11 DIAGNOSIS — N189 Chronic kidney disease, unspecified: Secondary | ICD-10-CM | POA: Diagnosis not present

## 2018-07-11 DIAGNOSIS — R2689 Other abnormalities of gait and mobility: Secondary | ICD-10-CM | POA: Diagnosis not present

## 2018-07-11 DIAGNOSIS — M6281 Muscle weakness (generalized): Secondary | ICD-10-CM | POA: Diagnosis not present

## 2018-07-12 DIAGNOSIS — M6281 Muscle weakness (generalized): Secondary | ICD-10-CM | POA: Diagnosis not present

## 2018-07-12 DIAGNOSIS — N189 Chronic kidney disease, unspecified: Secondary | ICD-10-CM | POA: Diagnosis not present

## 2018-07-12 DIAGNOSIS — R2689 Other abnormalities of gait and mobility: Secondary | ICD-10-CM | POA: Diagnosis not present

## 2018-07-13 DIAGNOSIS — R2689 Other abnormalities of gait and mobility: Secondary | ICD-10-CM | POA: Diagnosis not present

## 2018-07-13 DIAGNOSIS — N189 Chronic kidney disease, unspecified: Secondary | ICD-10-CM | POA: Diagnosis not present

## 2018-07-13 DIAGNOSIS — M6281 Muscle weakness (generalized): Secondary | ICD-10-CM | POA: Diagnosis not present

## 2018-07-14 DIAGNOSIS — M6281 Muscle weakness (generalized): Secondary | ICD-10-CM | POA: Diagnosis not present

## 2018-07-14 DIAGNOSIS — R2689 Other abnormalities of gait and mobility: Secondary | ICD-10-CM | POA: Diagnosis not present

## 2018-07-14 DIAGNOSIS — N189 Chronic kidney disease, unspecified: Secondary | ICD-10-CM | POA: Diagnosis not present

## 2018-07-15 DIAGNOSIS — R2689 Other abnormalities of gait and mobility: Secondary | ICD-10-CM | POA: Diagnosis not present

## 2018-07-15 DIAGNOSIS — M6281 Muscle weakness (generalized): Secondary | ICD-10-CM | POA: Diagnosis not present

## 2018-07-15 DIAGNOSIS — N189 Chronic kidney disease, unspecified: Secondary | ICD-10-CM | POA: Diagnosis not present

## 2018-07-18 DIAGNOSIS — R2689 Other abnormalities of gait and mobility: Secondary | ICD-10-CM | POA: Diagnosis not present

## 2018-07-18 DIAGNOSIS — M6281 Muscle weakness (generalized): Secondary | ICD-10-CM | POA: Diagnosis not present

## 2018-07-18 DIAGNOSIS — N189 Chronic kidney disease, unspecified: Secondary | ICD-10-CM | POA: Diagnosis not present

## 2018-07-19 DIAGNOSIS — N189 Chronic kidney disease, unspecified: Secondary | ICD-10-CM | POA: Diagnosis not present

## 2018-07-19 DIAGNOSIS — R2689 Other abnormalities of gait and mobility: Secondary | ICD-10-CM | POA: Diagnosis not present

## 2018-07-19 DIAGNOSIS — M6281 Muscle weakness (generalized): Secondary | ICD-10-CM | POA: Diagnosis not present

## 2018-07-20 DIAGNOSIS — N189 Chronic kidney disease, unspecified: Secondary | ICD-10-CM | POA: Diagnosis not present

## 2018-07-20 DIAGNOSIS — M6281 Muscle weakness (generalized): Secondary | ICD-10-CM | POA: Diagnosis not present

## 2018-07-20 DIAGNOSIS — R2689 Other abnormalities of gait and mobility: Secondary | ICD-10-CM | POA: Diagnosis not present

## 2018-07-21 DIAGNOSIS — R2689 Other abnormalities of gait and mobility: Secondary | ICD-10-CM | POA: Diagnosis not present

## 2018-07-21 DIAGNOSIS — N189 Chronic kidney disease, unspecified: Secondary | ICD-10-CM | POA: Diagnosis not present

## 2018-07-21 DIAGNOSIS — M6281 Muscle weakness (generalized): Secondary | ICD-10-CM | POA: Diagnosis not present

## 2018-07-22 DIAGNOSIS — M6281 Muscle weakness (generalized): Secondary | ICD-10-CM | POA: Diagnosis not present

## 2018-07-22 DIAGNOSIS — R2689 Other abnormalities of gait and mobility: Secondary | ICD-10-CM | POA: Diagnosis not present

## 2018-07-22 DIAGNOSIS — N189 Chronic kidney disease, unspecified: Secondary | ICD-10-CM | POA: Diagnosis not present

## 2018-07-25 DIAGNOSIS — R2689 Other abnormalities of gait and mobility: Secondary | ICD-10-CM | POA: Diagnosis not present

## 2018-07-25 DIAGNOSIS — N189 Chronic kidney disease, unspecified: Secondary | ICD-10-CM | POA: Diagnosis not present

## 2018-07-25 DIAGNOSIS — M6281 Muscle weakness (generalized): Secondary | ICD-10-CM | POA: Diagnosis not present

## 2018-07-26 DIAGNOSIS — N189 Chronic kidney disease, unspecified: Secondary | ICD-10-CM | POA: Diagnosis not present

## 2018-07-26 DIAGNOSIS — R2689 Other abnormalities of gait and mobility: Secondary | ICD-10-CM | POA: Diagnosis not present

## 2018-07-26 DIAGNOSIS — M6281 Muscle weakness (generalized): Secondary | ICD-10-CM | POA: Diagnosis not present

## 2018-07-27 DIAGNOSIS — N189 Chronic kidney disease, unspecified: Secondary | ICD-10-CM | POA: Diagnosis not present

## 2018-07-27 DIAGNOSIS — M6281 Muscle weakness (generalized): Secondary | ICD-10-CM | POA: Diagnosis not present

## 2018-07-27 DIAGNOSIS — R2689 Other abnormalities of gait and mobility: Secondary | ICD-10-CM | POA: Diagnosis not present

## 2018-07-28 DIAGNOSIS — R2689 Other abnormalities of gait and mobility: Secondary | ICD-10-CM | POA: Diagnosis not present

## 2018-07-28 DIAGNOSIS — M6281 Muscle weakness (generalized): Secondary | ICD-10-CM | POA: Diagnosis not present

## 2018-07-28 DIAGNOSIS — N189 Chronic kidney disease, unspecified: Secondary | ICD-10-CM | POA: Diagnosis not present

## 2018-07-29 DIAGNOSIS — E038 Other specified hypothyroidism: Secondary | ICD-10-CM | POA: Diagnosis not present

## 2018-07-29 DIAGNOSIS — M6281 Muscle weakness (generalized): Secondary | ICD-10-CM | POA: Diagnosis not present

## 2018-07-29 DIAGNOSIS — N189 Chronic kidney disease, unspecified: Secondary | ICD-10-CM | POA: Diagnosis not present

## 2018-07-29 DIAGNOSIS — H42 Glaucoma in diseases classified elsewhere: Secondary | ICD-10-CM | POA: Diagnosis not present

## 2018-07-29 DIAGNOSIS — D098 Carcinoma in situ of other specified sites: Secondary | ICD-10-CM | POA: Diagnosis not present

## 2018-07-29 DIAGNOSIS — R2689 Other abnormalities of gait and mobility: Secondary | ICD-10-CM | POA: Diagnosis not present

## 2018-08-01 DIAGNOSIS — N189 Chronic kidney disease, unspecified: Secondary | ICD-10-CM | POA: Diagnosis not present

## 2018-08-01 DIAGNOSIS — R2689 Other abnormalities of gait and mobility: Secondary | ICD-10-CM | POA: Diagnosis not present

## 2018-08-01 DIAGNOSIS — M6281 Muscle weakness (generalized): Secondary | ICD-10-CM | POA: Diagnosis not present

## 2018-08-02 DIAGNOSIS — R2689 Other abnormalities of gait and mobility: Secondary | ICD-10-CM | POA: Diagnosis not present

## 2018-08-02 DIAGNOSIS — N189 Chronic kidney disease, unspecified: Secondary | ICD-10-CM | POA: Diagnosis not present

## 2018-08-02 DIAGNOSIS — M6281 Muscle weakness (generalized): Secondary | ICD-10-CM | POA: Diagnosis not present

## 2018-08-03 DIAGNOSIS — M6281 Muscle weakness (generalized): Secondary | ICD-10-CM | POA: Diagnosis not present

## 2018-08-03 DIAGNOSIS — R2689 Other abnormalities of gait and mobility: Secondary | ICD-10-CM | POA: Diagnosis not present

## 2018-08-03 DIAGNOSIS — N189 Chronic kidney disease, unspecified: Secondary | ICD-10-CM | POA: Diagnosis not present

## 2018-08-04 DIAGNOSIS — R2689 Other abnormalities of gait and mobility: Secondary | ICD-10-CM | POA: Diagnosis not present

## 2018-08-04 DIAGNOSIS — N189 Chronic kidney disease, unspecified: Secondary | ICD-10-CM | POA: Diagnosis not present

## 2018-08-04 DIAGNOSIS — M6281 Muscle weakness (generalized): Secondary | ICD-10-CM | POA: Diagnosis not present

## 2018-08-05 DIAGNOSIS — N189 Chronic kidney disease, unspecified: Secondary | ICD-10-CM | POA: Diagnosis not present

## 2018-08-05 DIAGNOSIS — R2689 Other abnormalities of gait and mobility: Secondary | ICD-10-CM | POA: Diagnosis not present

## 2018-08-05 DIAGNOSIS — M6281 Muscle weakness (generalized): Secondary | ICD-10-CM | POA: Diagnosis not present

## 2018-08-31 DIAGNOSIS — E038 Other specified hypothyroidism: Secondary | ICD-10-CM | POA: Diagnosis not present

## 2018-08-31 DIAGNOSIS — D098 Carcinoma in situ of other specified sites: Secondary | ICD-10-CM | POA: Diagnosis not present

## 2018-08-31 DIAGNOSIS — H42 Glaucoma in diseases classified elsewhere: Secondary | ICD-10-CM | POA: Diagnosis not present

## 2018-09-30 DIAGNOSIS — D098 Carcinoma in situ of other specified sites: Secondary | ICD-10-CM | POA: Diagnosis not present

## 2018-09-30 DIAGNOSIS — H42 Glaucoma in diseases classified elsewhere: Secondary | ICD-10-CM | POA: Diagnosis not present

## 2018-09-30 DIAGNOSIS — E038 Other specified hypothyroidism: Secondary | ICD-10-CM | POA: Diagnosis not present

## 2018-10-17 ENCOUNTER — Other Ambulatory Visit: Payer: Self-pay

## 2018-10-27 DIAGNOSIS — R1319 Other dysphagia: Secondary | ICD-10-CM | POA: Diagnosis not present

## 2018-10-27 DIAGNOSIS — I2609 Other pulmonary embolism with acute cor pulmonale: Secondary | ICD-10-CM | POA: Diagnosis not present

## 2018-11-01 DIAGNOSIS — R1319 Other dysphagia: Secondary | ICD-10-CM | POA: Diagnosis not present

## 2018-11-01 DIAGNOSIS — I2609 Other pulmonary embolism with acute cor pulmonale: Secondary | ICD-10-CM | POA: Diagnosis not present

## 2018-11-02 DIAGNOSIS — I2609 Other pulmonary embolism with acute cor pulmonale: Secondary | ICD-10-CM | POA: Diagnosis not present

## 2018-11-02 DIAGNOSIS — R1319 Other dysphagia: Secondary | ICD-10-CM | POA: Diagnosis not present

## 2018-11-03 DIAGNOSIS — R1319 Other dysphagia: Secondary | ICD-10-CM | POA: Diagnosis not present

## 2018-11-03 DIAGNOSIS — I2609 Other pulmonary embolism with acute cor pulmonale: Secondary | ICD-10-CM | POA: Diagnosis not present

## 2018-11-03 DIAGNOSIS — D098 Carcinoma in situ of other specified sites: Secondary | ICD-10-CM | POA: Diagnosis not present

## 2018-11-03 DIAGNOSIS — H42 Glaucoma in diseases classified elsewhere: Secondary | ICD-10-CM | POA: Diagnosis not present

## 2018-11-03 DIAGNOSIS — E038 Other specified hypothyroidism: Secondary | ICD-10-CM | POA: Diagnosis not present

## 2018-11-09 DIAGNOSIS — I2609 Other pulmonary embolism with acute cor pulmonale: Secondary | ICD-10-CM | POA: Diagnosis not present

## 2018-11-09 DIAGNOSIS — R1319 Other dysphagia: Secondary | ICD-10-CM | POA: Diagnosis not present

## 2018-11-10 DIAGNOSIS — I2609 Other pulmonary embolism with acute cor pulmonale: Secondary | ICD-10-CM | POA: Diagnosis not present

## 2018-11-10 DIAGNOSIS — R1319 Other dysphagia: Secondary | ICD-10-CM | POA: Diagnosis not present

## 2018-11-15 DIAGNOSIS — R1319 Other dysphagia: Secondary | ICD-10-CM | POA: Diagnosis not present

## 2018-11-15 DIAGNOSIS — I2609 Other pulmonary embolism with acute cor pulmonale: Secondary | ICD-10-CM | POA: Diagnosis not present

## 2018-11-16 DIAGNOSIS — R1319 Other dysphagia: Secondary | ICD-10-CM | POA: Diagnosis not present

## 2018-11-16 DIAGNOSIS — I2609 Other pulmonary embolism with acute cor pulmonale: Secondary | ICD-10-CM | POA: Diagnosis not present

## 2018-11-17 DIAGNOSIS — R1319 Other dysphagia: Secondary | ICD-10-CM | POA: Diagnosis not present

## 2018-11-17 DIAGNOSIS — I2609 Other pulmonary embolism with acute cor pulmonale: Secondary | ICD-10-CM | POA: Diagnosis not present

## 2018-11-22 DIAGNOSIS — R1319 Other dysphagia: Secondary | ICD-10-CM | POA: Diagnosis not present

## 2018-11-22 DIAGNOSIS — I2609 Other pulmonary embolism with acute cor pulmonale: Secondary | ICD-10-CM | POA: Diagnosis not present

## 2018-11-23 DIAGNOSIS — R1319 Other dysphagia: Secondary | ICD-10-CM | POA: Diagnosis not present

## 2018-11-23 DIAGNOSIS — I2609 Other pulmonary embolism with acute cor pulmonale: Secondary | ICD-10-CM | POA: Diagnosis not present

## 2018-11-23 DIAGNOSIS — Z20828 Contact with and (suspected) exposure to other viral communicable diseases: Secondary | ICD-10-CM | POA: Diagnosis not present

## 2018-11-24 DIAGNOSIS — I2609 Other pulmonary embolism with acute cor pulmonale: Secondary | ICD-10-CM | POA: Diagnosis not present

## 2018-11-24 DIAGNOSIS — R1319 Other dysphagia: Secondary | ICD-10-CM | POA: Diagnosis not present

## 2018-11-28 DIAGNOSIS — R1319 Other dysphagia: Secondary | ICD-10-CM | POA: Diagnosis not present

## 2018-11-28 DIAGNOSIS — I2609 Other pulmonary embolism with acute cor pulmonale: Secondary | ICD-10-CM | POA: Diagnosis not present

## 2018-11-29 DIAGNOSIS — I2609 Other pulmonary embolism with acute cor pulmonale: Secondary | ICD-10-CM | POA: Diagnosis not present

## 2018-11-29 DIAGNOSIS — R1319 Other dysphagia: Secondary | ICD-10-CM | POA: Diagnosis not present

## 2018-11-30 DIAGNOSIS — I2609 Other pulmonary embolism with acute cor pulmonale: Secondary | ICD-10-CM | POA: Diagnosis not present

## 2018-11-30 DIAGNOSIS — Z20828 Contact with and (suspected) exposure to other viral communicable diseases: Secondary | ICD-10-CM | POA: Diagnosis not present

## 2018-11-30 DIAGNOSIS — R1319 Other dysphagia: Secondary | ICD-10-CM | POA: Diagnosis not present

## 2018-12-01 DIAGNOSIS — I2609 Other pulmonary embolism with acute cor pulmonale: Secondary | ICD-10-CM | POA: Diagnosis not present

## 2018-12-01 DIAGNOSIS — R1319 Other dysphagia: Secondary | ICD-10-CM | POA: Diagnosis not present

## 2018-12-04 DIAGNOSIS — H42 Glaucoma in diseases classified elsewhere: Secondary | ICD-10-CM | POA: Diagnosis not present

## 2018-12-04 DIAGNOSIS — D098 Carcinoma in situ of other specified sites: Secondary | ICD-10-CM | POA: Diagnosis not present

## 2018-12-04 DIAGNOSIS — E038 Other specified hypothyroidism: Secondary | ICD-10-CM | POA: Diagnosis not present

## 2018-12-06 DIAGNOSIS — R1319 Other dysphagia: Secondary | ICD-10-CM | POA: Diagnosis not present

## 2018-12-06 DIAGNOSIS — I2609 Other pulmonary embolism with acute cor pulmonale: Secondary | ICD-10-CM | POA: Diagnosis not present

## 2018-12-07 DIAGNOSIS — Z20828 Contact with and (suspected) exposure to other viral communicable diseases: Secondary | ICD-10-CM | POA: Diagnosis not present

## 2018-12-07 DIAGNOSIS — R1319 Other dysphagia: Secondary | ICD-10-CM | POA: Diagnosis not present

## 2018-12-07 DIAGNOSIS — I2609 Other pulmonary embolism with acute cor pulmonale: Secondary | ICD-10-CM | POA: Diagnosis not present

## 2018-12-13 DIAGNOSIS — R1319 Other dysphagia: Secondary | ICD-10-CM | POA: Diagnosis not present

## 2018-12-13 DIAGNOSIS — Z20828 Contact with and (suspected) exposure to other viral communicable diseases: Secondary | ICD-10-CM | POA: Diagnosis not present

## 2018-12-13 DIAGNOSIS — I2609 Other pulmonary embolism with acute cor pulmonale: Secondary | ICD-10-CM | POA: Diagnosis not present

## 2018-12-14 DIAGNOSIS — I2609 Other pulmonary embolism with acute cor pulmonale: Secondary | ICD-10-CM | POA: Diagnosis not present

## 2018-12-14 DIAGNOSIS — R1319 Other dysphagia: Secondary | ICD-10-CM | POA: Diagnosis not present

## 2018-12-15 DIAGNOSIS — Z23 Encounter for immunization: Secondary | ICD-10-CM | POA: Diagnosis not present

## 2018-12-15 DIAGNOSIS — I2609 Other pulmonary embolism with acute cor pulmonale: Secondary | ICD-10-CM | POA: Diagnosis not present

## 2018-12-15 DIAGNOSIS — R1319 Other dysphagia: Secondary | ICD-10-CM | POA: Diagnosis not present

## 2018-12-20 DIAGNOSIS — R1319 Other dysphagia: Secondary | ICD-10-CM | POA: Diagnosis not present

## 2018-12-20 DIAGNOSIS — Z23 Encounter for immunization: Secondary | ICD-10-CM | POA: Diagnosis not present

## 2018-12-20 DIAGNOSIS — Z20828 Contact with and (suspected) exposure to other viral communicable diseases: Secondary | ICD-10-CM | POA: Diagnosis not present

## 2018-12-20 DIAGNOSIS — I2609 Other pulmonary embolism with acute cor pulmonale: Secondary | ICD-10-CM | POA: Diagnosis not present

## 2018-12-21 DIAGNOSIS — I2609 Other pulmonary embolism with acute cor pulmonale: Secondary | ICD-10-CM | POA: Diagnosis not present

## 2018-12-21 DIAGNOSIS — Z23 Encounter for immunization: Secondary | ICD-10-CM | POA: Diagnosis not present

## 2018-12-21 DIAGNOSIS — R1319 Other dysphagia: Secondary | ICD-10-CM | POA: Diagnosis not present

## 2018-12-22 DIAGNOSIS — R1319 Other dysphagia: Secondary | ICD-10-CM | POA: Diagnosis not present

## 2018-12-22 DIAGNOSIS — Z23 Encounter for immunization: Secondary | ICD-10-CM | POA: Diagnosis not present

## 2018-12-22 DIAGNOSIS — I2609 Other pulmonary embolism with acute cor pulmonale: Secondary | ICD-10-CM | POA: Diagnosis not present

## 2018-12-27 DIAGNOSIS — Z20828 Contact with and (suspected) exposure to other viral communicable diseases: Secondary | ICD-10-CM | POA: Diagnosis not present

## 2019-01-03 DIAGNOSIS — Z20828 Contact with and (suspected) exposure to other viral communicable diseases: Secondary | ICD-10-CM | POA: Diagnosis not present

## 2019-01-07 DIAGNOSIS — D098 Carcinoma in situ of other specified sites: Secondary | ICD-10-CM | POA: Diagnosis not present

## 2019-01-07 DIAGNOSIS — H42 Glaucoma in diseases classified elsewhere: Secondary | ICD-10-CM | POA: Diagnosis not present

## 2019-01-07 DIAGNOSIS — E038 Other specified hypothyroidism: Secondary | ICD-10-CM | POA: Diagnosis not present

## 2019-01-10 DIAGNOSIS — Z20828 Contact with and (suspected) exposure to other viral communicable diseases: Secondary | ICD-10-CM | POA: Diagnosis not present

## 2019-01-17 DIAGNOSIS — Z20828 Contact with and (suspected) exposure to other viral communicable diseases: Secondary | ICD-10-CM | POA: Diagnosis not present

## 2019-01-24 DIAGNOSIS — Z20828 Contact with and (suspected) exposure to other viral communicable diseases: Secondary | ICD-10-CM | POA: Diagnosis not present

## 2019-01-31 DIAGNOSIS — Z20828 Contact with and (suspected) exposure to other viral communicable diseases: Secondary | ICD-10-CM | POA: Diagnosis not present

## 2019-02-06 DIAGNOSIS — Z20828 Contact with and (suspected) exposure to other viral communicable diseases: Secondary | ICD-10-CM | POA: Diagnosis not present

## 2019-02-07 DIAGNOSIS — E038 Other specified hypothyroidism: Secondary | ICD-10-CM | POA: Diagnosis not present

## 2019-02-07 DIAGNOSIS — H42 Glaucoma in diseases classified elsewhere: Secondary | ICD-10-CM | POA: Diagnosis not present

## 2019-02-07 DIAGNOSIS — D098 Carcinoma in situ of other specified sites: Secondary | ICD-10-CM | POA: Diagnosis not present

## 2019-03-17 DIAGNOSIS — U071 COVID-19: Secondary | ICD-10-CM | POA: Diagnosis not present

## 2019-03-17 DIAGNOSIS — R509 Fever, unspecified: Secondary | ICD-10-CM | POA: Diagnosis not present

## 2019-03-17 DIAGNOSIS — R4182 Altered mental status, unspecified: Secondary | ICD-10-CM | POA: Diagnosis not present

## 2019-03-21 DIAGNOSIS — R509 Fever, unspecified: Secondary | ICD-10-CM | POA: Diagnosis not present

## 2019-03-21 DIAGNOSIS — R4182 Altered mental status, unspecified: Secondary | ICD-10-CM | POA: Diagnosis not present

## 2019-03-21 DIAGNOSIS — U071 COVID-19: Secondary | ICD-10-CM | POA: Diagnosis not present

## 2019-03-23 DIAGNOSIS — Z23 Encounter for immunization: Secondary | ICD-10-CM | POA: Diagnosis not present

## 2019-03-28 DIAGNOSIS — R4182 Altered mental status, unspecified: Secondary | ICD-10-CM | POA: Diagnosis not present

## 2019-03-28 DIAGNOSIS — R509 Fever, unspecified: Secondary | ICD-10-CM | POA: Diagnosis not present

## 2019-03-28 DIAGNOSIS — U071 COVID-19: Secondary | ICD-10-CM | POA: Diagnosis not present

## 2019-04-09 DIAGNOSIS — D098 Carcinoma in situ of other specified sites: Secondary | ICD-10-CM | POA: Diagnosis not present

## 2019-04-09 DIAGNOSIS — E038 Other specified hypothyroidism: Secondary | ICD-10-CM | POA: Diagnosis not present

## 2019-04-09 DIAGNOSIS — H42 Glaucoma in diseases classified elsewhere: Secondary | ICD-10-CM | POA: Diagnosis not present

## 2019-04-20 ENCOUNTER — Encounter: Payer: Self-pay | Admitting: Family Medicine

## 2019-04-25 DIAGNOSIS — R1311 Dysphagia, oral phase: Secondary | ICD-10-CM | POA: Diagnosis not present

## 2019-04-25 DIAGNOSIS — G309 Alzheimer's disease, unspecified: Secondary | ICD-10-CM | POA: Diagnosis not present

## 2019-04-26 DIAGNOSIS — G309 Alzheimer's disease, unspecified: Secondary | ICD-10-CM | POA: Diagnosis not present

## 2019-04-26 DIAGNOSIS — R1311 Dysphagia, oral phase: Secondary | ICD-10-CM | POA: Diagnosis not present

## 2019-04-27 DIAGNOSIS — R1311 Dysphagia, oral phase: Secondary | ICD-10-CM | POA: Diagnosis not present

## 2019-04-27 DIAGNOSIS — G309 Alzheimer's disease, unspecified: Secondary | ICD-10-CM | POA: Diagnosis not present

## 2019-04-28 DIAGNOSIS — R451 Restlessness and agitation: Secondary | ICD-10-CM | POA: Diagnosis not present

## 2019-04-28 DIAGNOSIS — R509 Fever, unspecified: Secondary | ICD-10-CM | POA: Diagnosis not present

## 2019-04-30 DIAGNOSIS — R451 Restlessness and agitation: Secondary | ICD-10-CM | POA: Diagnosis not present

## 2019-04-30 DIAGNOSIS — R509 Fever, unspecified: Secondary | ICD-10-CM | POA: Diagnosis not present

## 2019-05-02 DIAGNOSIS — R1311 Dysphagia, oral phase: Secondary | ICD-10-CM | POA: Diagnosis not present

## 2019-05-02 DIAGNOSIS — G309 Alzheimer's disease, unspecified: Secondary | ICD-10-CM | POA: Diagnosis not present

## 2019-05-03 DIAGNOSIS — R1311 Dysphagia, oral phase: Secondary | ICD-10-CM | POA: Diagnosis not present

## 2019-05-03 DIAGNOSIS — G309 Alzheimer's disease, unspecified: Secondary | ICD-10-CM | POA: Diagnosis not present

## 2019-05-06 DIAGNOSIS — G309 Alzheimer's disease, unspecified: Secondary | ICD-10-CM | POA: Diagnosis not present

## 2019-05-06 DIAGNOSIS — R1311 Dysphagia, oral phase: Secondary | ICD-10-CM | POA: Diagnosis not present

## 2019-05-09 DIAGNOSIS — R1311 Dysphagia, oral phase: Secondary | ICD-10-CM | POA: Diagnosis not present

## 2019-05-09 DIAGNOSIS — G309 Alzheimer's disease, unspecified: Secondary | ICD-10-CM | POA: Diagnosis not present

## 2019-05-10 DIAGNOSIS — R1311 Dysphagia, oral phase: Secondary | ICD-10-CM | POA: Diagnosis not present

## 2019-05-10 DIAGNOSIS — G309 Alzheimer's disease, unspecified: Secondary | ICD-10-CM | POA: Diagnosis not present

## 2019-05-11 DIAGNOSIS — R1311 Dysphagia, oral phase: Secondary | ICD-10-CM | POA: Diagnosis not present

## 2019-05-11 DIAGNOSIS — G309 Alzheimer's disease, unspecified: Secondary | ICD-10-CM | POA: Diagnosis not present

## 2019-05-12 DIAGNOSIS — E038 Other specified hypothyroidism: Secondary | ICD-10-CM | POA: Diagnosis not present

## 2019-05-12 DIAGNOSIS — H42 Glaucoma in diseases classified elsewhere: Secondary | ICD-10-CM | POA: Diagnosis not present

## 2019-05-12 DIAGNOSIS — D098 Carcinoma in situ of other specified sites: Secondary | ICD-10-CM | POA: Diagnosis not present

## 2019-06-11 DIAGNOSIS — D098 Carcinoma in situ of other specified sites: Secondary | ICD-10-CM | POA: Diagnosis not present

## 2019-06-11 DIAGNOSIS — E038 Other specified hypothyroidism: Secondary | ICD-10-CM | POA: Diagnosis not present

## 2019-06-11 DIAGNOSIS — H42 Glaucoma in diseases classified elsewhere: Secondary | ICD-10-CM | POA: Diagnosis not present

## 2019-06-27 DIAGNOSIS — L853 Xerosis cutis: Secondary | ICD-10-CM | POA: Diagnosis not present

## 2019-06-27 DIAGNOSIS — R262 Difficulty in walking, not elsewhere classified: Secondary | ICD-10-CM | POA: Diagnosis not present

## 2019-06-27 DIAGNOSIS — B351 Tinea unguium: Secondary | ICD-10-CM | POA: Diagnosis not present

## 2019-06-27 DIAGNOSIS — I739 Peripheral vascular disease, unspecified: Secondary | ICD-10-CM | POA: Diagnosis not present

## 2019-07-13 DIAGNOSIS — D098 Carcinoma in situ of other specified sites: Secondary | ICD-10-CM | POA: Diagnosis not present

## 2019-07-13 DIAGNOSIS — H42 Glaucoma in diseases classified elsewhere: Secondary | ICD-10-CM | POA: Diagnosis not present

## 2019-07-13 DIAGNOSIS — E038 Other specified hypothyroidism: Secondary | ICD-10-CM | POA: Diagnosis not present

## 2019-07-18 DIAGNOSIS — Z5181 Encounter for therapeutic drug level monitoring: Secondary | ICD-10-CM | POA: Diagnosis not present

## 2019-07-18 DIAGNOSIS — Z79899 Other long term (current) drug therapy: Secondary | ICD-10-CM | POA: Diagnosis not present

## 2019-07-22 DIAGNOSIS — L02818 Cutaneous abscess of other sites: Secondary | ICD-10-CM | POA: Diagnosis not present

## 2019-07-28 DIAGNOSIS — D449 Neoplasm of uncertain behavior of unspecified endocrine gland: Secondary | ICD-10-CM | POA: Diagnosis not present

## 2019-07-28 DIAGNOSIS — L089 Local infection of the skin and subcutaneous tissue, unspecified: Secondary | ICD-10-CM | POA: Diagnosis not present

## 2019-07-28 DIAGNOSIS — M6281 Muscle weakness (generalized): Secondary | ICD-10-CM | POA: Diagnosis not present

## 2019-08-02 DIAGNOSIS — R1319 Other dysphagia: Secondary | ICD-10-CM | POA: Diagnosis not present

## 2019-08-02 DIAGNOSIS — G309 Alzheimer's disease, unspecified: Secondary | ICD-10-CM | POA: Diagnosis not present

## 2019-08-03 DIAGNOSIS — R1319 Other dysphagia: Secondary | ICD-10-CM | POA: Diagnosis not present

## 2019-08-03 DIAGNOSIS — G309 Alzheimer's disease, unspecified: Secondary | ICD-10-CM | POA: Diagnosis not present

## 2019-08-08 DIAGNOSIS — G309 Alzheimer's disease, unspecified: Secondary | ICD-10-CM | POA: Diagnosis not present

## 2019-08-08 DIAGNOSIS — R1319 Other dysphagia: Secondary | ICD-10-CM | POA: Diagnosis not present

## 2019-08-10 DIAGNOSIS — R1319 Other dysphagia: Secondary | ICD-10-CM | POA: Diagnosis not present

## 2019-08-10 DIAGNOSIS — G309 Alzheimer's disease, unspecified: Secondary | ICD-10-CM | POA: Diagnosis not present

## 2019-08-20 DIAGNOSIS — I10 Essential (primary) hypertension: Secondary | ICD-10-CM | POA: Diagnosis not present

## 2019-08-20 DIAGNOSIS — E038 Other specified hypothyroidism: Secondary | ICD-10-CM | POA: Diagnosis not present

## 2019-08-20 DIAGNOSIS — F330 Major depressive disorder, recurrent, mild: Secondary | ICD-10-CM | POA: Diagnosis not present

## 2019-09-14 DIAGNOSIS — R2689 Other abnormalities of gait and mobility: Secondary | ICD-10-CM | POA: Diagnosis not present

## 2019-09-14 DIAGNOSIS — G309 Alzheimer's disease, unspecified: Secondary | ICD-10-CM | POA: Diagnosis not present

## 2019-09-14 DIAGNOSIS — R1319 Other dysphagia: Secondary | ICD-10-CM | POA: Diagnosis not present

## 2019-09-14 DIAGNOSIS — M6281 Muscle weakness (generalized): Secondary | ICD-10-CM | POA: Diagnosis not present

## 2019-09-21 DIAGNOSIS — I10 Essential (primary) hypertension: Secondary | ICD-10-CM | POA: Diagnosis not present

## 2019-09-21 DIAGNOSIS — E038 Other specified hypothyroidism: Secondary | ICD-10-CM | POA: Diagnosis not present

## 2019-09-21 DIAGNOSIS — F330 Major depressive disorder, recurrent, mild: Secondary | ICD-10-CM | POA: Diagnosis not present

## 2019-09-26 DIAGNOSIS — G309 Alzheimer's disease, unspecified: Secondary | ICD-10-CM | POA: Diagnosis not present

## 2019-09-26 DIAGNOSIS — R2689 Other abnormalities of gait and mobility: Secondary | ICD-10-CM | POA: Diagnosis not present

## 2019-09-26 DIAGNOSIS — R1319 Other dysphagia: Secondary | ICD-10-CM | POA: Diagnosis not present

## 2019-09-26 DIAGNOSIS — M6281 Muscle weakness (generalized): Secondary | ICD-10-CM | POA: Diagnosis not present

## 2019-09-27 DIAGNOSIS — R2689 Other abnormalities of gait and mobility: Secondary | ICD-10-CM | POA: Diagnosis not present

## 2019-09-27 DIAGNOSIS — G309 Alzheimer's disease, unspecified: Secondary | ICD-10-CM | POA: Diagnosis not present

## 2019-09-27 DIAGNOSIS — R1319 Other dysphagia: Secondary | ICD-10-CM | POA: Diagnosis not present

## 2019-09-27 DIAGNOSIS — M6281 Muscle weakness (generalized): Secondary | ICD-10-CM | POA: Diagnosis not present

## 2019-09-28 DIAGNOSIS — R2689 Other abnormalities of gait and mobility: Secondary | ICD-10-CM | POA: Diagnosis not present

## 2019-09-28 DIAGNOSIS — G309 Alzheimer's disease, unspecified: Secondary | ICD-10-CM | POA: Diagnosis not present

## 2019-09-28 DIAGNOSIS — R1319 Other dysphagia: Secondary | ICD-10-CM | POA: Diagnosis not present

## 2019-09-28 DIAGNOSIS — M6281 Muscle weakness (generalized): Secondary | ICD-10-CM | POA: Diagnosis not present

## 2019-09-29 DIAGNOSIS — M6281 Muscle weakness (generalized): Secondary | ICD-10-CM | POA: Diagnosis not present

## 2019-09-29 DIAGNOSIS — G309 Alzheimer's disease, unspecified: Secondary | ICD-10-CM | POA: Diagnosis not present

## 2019-09-29 DIAGNOSIS — R1319 Other dysphagia: Secondary | ICD-10-CM | POA: Diagnosis not present

## 2019-09-29 DIAGNOSIS — R2689 Other abnormalities of gait and mobility: Secondary | ICD-10-CM | POA: Diagnosis not present

## 2019-10-03 DIAGNOSIS — G309 Alzheimer's disease, unspecified: Secondary | ICD-10-CM | POA: Diagnosis not present

## 2019-10-03 DIAGNOSIS — M6281 Muscle weakness (generalized): Secondary | ICD-10-CM | POA: Diagnosis not present

## 2019-10-03 DIAGNOSIS — R2689 Other abnormalities of gait and mobility: Secondary | ICD-10-CM | POA: Diagnosis not present

## 2019-10-03 DIAGNOSIS — R1319 Other dysphagia: Secondary | ICD-10-CM | POA: Diagnosis not present

## 2019-10-04 DIAGNOSIS — M6281 Muscle weakness (generalized): Secondary | ICD-10-CM | POA: Diagnosis not present

## 2019-10-04 DIAGNOSIS — R2689 Other abnormalities of gait and mobility: Secondary | ICD-10-CM | POA: Diagnosis not present

## 2019-10-04 DIAGNOSIS — R1319 Other dysphagia: Secondary | ICD-10-CM | POA: Diagnosis not present

## 2019-10-04 DIAGNOSIS — G309 Alzheimer's disease, unspecified: Secondary | ICD-10-CM | POA: Diagnosis not present

## 2019-10-05 DIAGNOSIS — M6281 Muscle weakness (generalized): Secondary | ICD-10-CM | POA: Diagnosis not present

## 2019-10-05 DIAGNOSIS — G309 Alzheimer's disease, unspecified: Secondary | ICD-10-CM | POA: Diagnosis not present

## 2019-10-05 DIAGNOSIS — R2689 Other abnormalities of gait and mobility: Secondary | ICD-10-CM | POA: Diagnosis not present

## 2019-10-05 DIAGNOSIS — R1319 Other dysphagia: Secondary | ICD-10-CM | POA: Diagnosis not present

## 2019-10-10 DIAGNOSIS — R2689 Other abnormalities of gait and mobility: Secondary | ICD-10-CM | POA: Diagnosis not present

## 2019-10-10 DIAGNOSIS — R1319 Other dysphagia: Secondary | ICD-10-CM | POA: Diagnosis not present

## 2019-10-10 DIAGNOSIS — G309 Alzheimer's disease, unspecified: Secondary | ICD-10-CM | POA: Diagnosis not present

## 2019-10-10 DIAGNOSIS — M6281 Muscle weakness (generalized): Secondary | ICD-10-CM | POA: Diagnosis not present

## 2019-10-12 DIAGNOSIS — R2689 Other abnormalities of gait and mobility: Secondary | ICD-10-CM | POA: Diagnosis not present

## 2019-10-12 DIAGNOSIS — G309 Alzheimer's disease, unspecified: Secondary | ICD-10-CM | POA: Diagnosis not present

## 2019-10-12 DIAGNOSIS — R1319 Other dysphagia: Secondary | ICD-10-CM | POA: Diagnosis not present

## 2019-10-12 DIAGNOSIS — M6281 Muscle weakness (generalized): Secondary | ICD-10-CM | POA: Diagnosis not present

## 2019-10-13 DIAGNOSIS — R1319 Other dysphagia: Secondary | ICD-10-CM | POA: Diagnosis not present

## 2019-10-13 DIAGNOSIS — M6281 Muscle weakness (generalized): Secondary | ICD-10-CM | POA: Diagnosis not present

## 2019-10-13 DIAGNOSIS — R2689 Other abnormalities of gait and mobility: Secondary | ICD-10-CM | POA: Diagnosis not present

## 2019-10-13 DIAGNOSIS — G309 Alzheimer's disease, unspecified: Secondary | ICD-10-CM | POA: Diagnosis not present

## 2019-10-14 DIAGNOSIS — M6281 Muscle weakness (generalized): Secondary | ICD-10-CM | POA: Diagnosis not present

## 2019-10-14 DIAGNOSIS — R1319 Other dysphagia: Secondary | ICD-10-CM | POA: Diagnosis not present

## 2019-10-14 DIAGNOSIS — R2689 Other abnormalities of gait and mobility: Secondary | ICD-10-CM | POA: Diagnosis not present

## 2019-10-14 DIAGNOSIS — G309 Alzheimer's disease, unspecified: Secondary | ICD-10-CM | POA: Diagnosis not present

## 2019-10-17 DIAGNOSIS — M6281 Muscle weakness (generalized): Secondary | ICD-10-CM | POA: Diagnosis not present

## 2019-10-17 DIAGNOSIS — R2689 Other abnormalities of gait and mobility: Secondary | ICD-10-CM | POA: Diagnosis not present

## 2019-10-18 DIAGNOSIS — M6281 Muscle weakness (generalized): Secondary | ICD-10-CM | POA: Diagnosis not present

## 2019-10-18 DIAGNOSIS — R2689 Other abnormalities of gait and mobility: Secondary | ICD-10-CM | POA: Diagnosis not present

## 2019-10-19 DIAGNOSIS — M6281 Muscle weakness (generalized): Secondary | ICD-10-CM | POA: Diagnosis not present

## 2019-10-19 DIAGNOSIS — R2689 Other abnormalities of gait and mobility: Secondary | ICD-10-CM | POA: Diagnosis not present

## 2019-10-20 DIAGNOSIS — M6281 Muscle weakness (generalized): Secondary | ICD-10-CM | POA: Diagnosis not present

## 2019-10-20 DIAGNOSIS — R2689 Other abnormalities of gait and mobility: Secondary | ICD-10-CM | POA: Diagnosis not present

## 2019-10-22 DIAGNOSIS — I10 Essential (primary) hypertension: Secondary | ICD-10-CM | POA: Diagnosis not present

## 2019-10-22 DIAGNOSIS — E038 Other specified hypothyroidism: Secondary | ICD-10-CM | POA: Diagnosis not present

## 2019-10-22 DIAGNOSIS — F330 Major depressive disorder, recurrent, mild: Secondary | ICD-10-CM | POA: Diagnosis not present

## 2019-10-24 DIAGNOSIS — M6281 Muscle weakness (generalized): Secondary | ICD-10-CM | POA: Diagnosis not present

## 2019-10-24 DIAGNOSIS — R2689 Other abnormalities of gait and mobility: Secondary | ICD-10-CM | POA: Diagnosis not present

## 2019-10-25 DIAGNOSIS — R2689 Other abnormalities of gait and mobility: Secondary | ICD-10-CM | POA: Diagnosis not present

## 2019-10-25 DIAGNOSIS — M6281 Muscle weakness (generalized): Secondary | ICD-10-CM | POA: Diagnosis not present

## 2019-10-26 DIAGNOSIS — R2689 Other abnormalities of gait and mobility: Secondary | ICD-10-CM | POA: Diagnosis not present

## 2019-10-26 DIAGNOSIS — M6281 Muscle weakness (generalized): Secondary | ICD-10-CM | POA: Diagnosis not present

## 2019-10-27 DIAGNOSIS — R2689 Other abnormalities of gait and mobility: Secondary | ICD-10-CM | POA: Diagnosis not present

## 2019-10-27 DIAGNOSIS — M6281 Muscle weakness (generalized): Secondary | ICD-10-CM | POA: Diagnosis not present

## 2019-10-30 DIAGNOSIS — R2689 Other abnormalities of gait and mobility: Secondary | ICD-10-CM | POA: Diagnosis not present

## 2019-10-30 DIAGNOSIS — M6281 Muscle weakness (generalized): Secondary | ICD-10-CM | POA: Diagnosis not present

## 2019-10-31 DIAGNOSIS — M6281 Muscle weakness (generalized): Secondary | ICD-10-CM | POA: Diagnosis not present

## 2019-10-31 DIAGNOSIS — R2689 Other abnormalities of gait and mobility: Secondary | ICD-10-CM | POA: Diagnosis not present

## 2019-11-01 DIAGNOSIS — M6281 Muscle weakness (generalized): Secondary | ICD-10-CM | POA: Diagnosis not present

## 2019-11-01 DIAGNOSIS — R2689 Other abnormalities of gait and mobility: Secondary | ICD-10-CM | POA: Diagnosis not present

## 2019-11-02 DIAGNOSIS — R2689 Other abnormalities of gait and mobility: Secondary | ICD-10-CM | POA: Diagnosis not present

## 2019-11-02 DIAGNOSIS — M6281 Muscle weakness (generalized): Secondary | ICD-10-CM | POA: Diagnosis not present

## 2019-11-06 DIAGNOSIS — R2689 Other abnormalities of gait and mobility: Secondary | ICD-10-CM | POA: Diagnosis not present

## 2019-11-06 DIAGNOSIS — M6281 Muscle weakness (generalized): Secondary | ICD-10-CM | POA: Diagnosis not present

## 2019-11-07 DIAGNOSIS — R2689 Other abnormalities of gait and mobility: Secondary | ICD-10-CM | POA: Diagnosis not present

## 2019-11-07 DIAGNOSIS — M6281 Muscle weakness (generalized): Secondary | ICD-10-CM | POA: Diagnosis not present

## 2019-11-08 DIAGNOSIS — R2689 Other abnormalities of gait and mobility: Secondary | ICD-10-CM | POA: Diagnosis not present

## 2019-11-08 DIAGNOSIS — M6281 Muscle weakness (generalized): Secondary | ICD-10-CM | POA: Diagnosis not present

## 2019-11-09 DIAGNOSIS — M6281 Muscle weakness (generalized): Secondary | ICD-10-CM | POA: Diagnosis not present

## 2019-11-09 DIAGNOSIS — R2689 Other abnormalities of gait and mobility: Secondary | ICD-10-CM | POA: Diagnosis not present

## 2019-11-10 DIAGNOSIS — M6281 Muscle weakness (generalized): Secondary | ICD-10-CM | POA: Diagnosis not present

## 2019-11-10 DIAGNOSIS — R2689 Other abnormalities of gait and mobility: Secondary | ICD-10-CM | POA: Diagnosis not present

## 2019-11-11 DIAGNOSIS — R2689 Other abnormalities of gait and mobility: Secondary | ICD-10-CM | POA: Diagnosis not present

## 2019-11-11 DIAGNOSIS — M6281 Muscle weakness (generalized): Secondary | ICD-10-CM | POA: Diagnosis not present

## 2019-11-14 DIAGNOSIS — R2689 Other abnormalities of gait and mobility: Secondary | ICD-10-CM | POA: Diagnosis not present

## 2019-11-14 DIAGNOSIS — M6281 Muscle weakness (generalized): Secondary | ICD-10-CM | POA: Diagnosis not present

## 2019-11-15 DIAGNOSIS — M6281 Muscle weakness (generalized): Secondary | ICD-10-CM | POA: Diagnosis not present

## 2019-11-15 DIAGNOSIS — R2689 Other abnormalities of gait and mobility: Secondary | ICD-10-CM | POA: Diagnosis not present

## 2019-11-16 DIAGNOSIS — R2689 Other abnormalities of gait and mobility: Secondary | ICD-10-CM | POA: Diagnosis not present

## 2019-11-16 DIAGNOSIS — M6281 Muscle weakness (generalized): Secondary | ICD-10-CM | POA: Diagnosis not present

## 2019-11-17 DIAGNOSIS — M6281 Muscle weakness (generalized): Secondary | ICD-10-CM | POA: Diagnosis not present

## 2019-11-17 DIAGNOSIS — R2689 Other abnormalities of gait and mobility: Secondary | ICD-10-CM | POA: Diagnosis not present

## 2019-11-21 DIAGNOSIS — M6281 Muscle weakness (generalized): Secondary | ICD-10-CM | POA: Diagnosis not present

## 2019-11-21 DIAGNOSIS — R2689 Other abnormalities of gait and mobility: Secondary | ICD-10-CM | POA: Diagnosis not present

## 2019-11-22 DIAGNOSIS — M6281 Muscle weakness (generalized): Secondary | ICD-10-CM | POA: Diagnosis not present

## 2019-11-22 DIAGNOSIS — R2689 Other abnormalities of gait and mobility: Secondary | ICD-10-CM | POA: Diagnosis not present

## 2019-11-23 DIAGNOSIS — F330 Major depressive disorder, recurrent, mild: Secondary | ICD-10-CM | POA: Diagnosis not present

## 2019-11-23 DIAGNOSIS — M6281 Muscle weakness (generalized): Secondary | ICD-10-CM | POA: Diagnosis not present

## 2019-11-23 DIAGNOSIS — E038 Other specified hypothyroidism: Secondary | ICD-10-CM | POA: Diagnosis not present

## 2019-11-23 DIAGNOSIS — I10 Essential (primary) hypertension: Secondary | ICD-10-CM | POA: Diagnosis not present

## 2019-11-23 DIAGNOSIS — R2689 Other abnormalities of gait and mobility: Secondary | ICD-10-CM | POA: Diagnosis not present

## 2019-11-24 DIAGNOSIS — R2689 Other abnormalities of gait and mobility: Secondary | ICD-10-CM | POA: Diagnosis not present

## 2019-11-24 DIAGNOSIS — M6281 Muscle weakness (generalized): Secondary | ICD-10-CM | POA: Diagnosis not present

## 2019-11-25 DIAGNOSIS — M6281 Muscle weakness (generalized): Secondary | ICD-10-CM | POA: Diagnosis not present

## 2019-11-25 DIAGNOSIS — R2689 Other abnormalities of gait and mobility: Secondary | ICD-10-CM | POA: Diagnosis not present

## 2019-11-28 DIAGNOSIS — R2689 Other abnormalities of gait and mobility: Secondary | ICD-10-CM | POA: Diagnosis not present

## 2019-11-28 DIAGNOSIS — M6281 Muscle weakness (generalized): Secondary | ICD-10-CM | POA: Diagnosis not present

## 2019-11-29 DIAGNOSIS — R2689 Other abnormalities of gait and mobility: Secondary | ICD-10-CM | POA: Diagnosis not present

## 2019-11-29 DIAGNOSIS — M6281 Muscle weakness (generalized): Secondary | ICD-10-CM | POA: Diagnosis not present

## 2019-11-30 DIAGNOSIS — R2689 Other abnormalities of gait and mobility: Secondary | ICD-10-CM | POA: Diagnosis not present

## 2019-11-30 DIAGNOSIS — M6281 Muscle weakness (generalized): Secondary | ICD-10-CM | POA: Diagnosis not present

## 2019-12-02 DIAGNOSIS — M6281 Muscle weakness (generalized): Secondary | ICD-10-CM | POA: Diagnosis not present

## 2019-12-02 DIAGNOSIS — R2689 Other abnormalities of gait and mobility: Secondary | ICD-10-CM | POA: Diagnosis not present

## 2019-12-05 DIAGNOSIS — M6281 Muscle weakness (generalized): Secondary | ICD-10-CM | POA: Diagnosis not present

## 2019-12-05 DIAGNOSIS — R2689 Other abnormalities of gait and mobility: Secondary | ICD-10-CM | POA: Diagnosis not present

## 2019-12-06 DIAGNOSIS — R2689 Other abnormalities of gait and mobility: Secondary | ICD-10-CM | POA: Diagnosis not present

## 2019-12-06 DIAGNOSIS — M6281 Muscle weakness (generalized): Secondary | ICD-10-CM | POA: Diagnosis not present

## 2019-12-07 DIAGNOSIS — M6281 Muscle weakness (generalized): Secondary | ICD-10-CM | POA: Diagnosis not present

## 2019-12-07 DIAGNOSIS — R2689 Other abnormalities of gait and mobility: Secondary | ICD-10-CM | POA: Diagnosis not present

## 2019-12-07 DIAGNOSIS — Z23 Encounter for immunization: Secondary | ICD-10-CM | POA: Diagnosis not present

## 2019-12-11 DIAGNOSIS — R2689 Other abnormalities of gait and mobility: Secondary | ICD-10-CM | POA: Diagnosis not present

## 2019-12-11 DIAGNOSIS — M6281 Muscle weakness (generalized): Secondary | ICD-10-CM | POA: Diagnosis not present

## 2019-12-12 DIAGNOSIS — I739 Peripheral vascular disease, unspecified: Secondary | ICD-10-CM | POA: Diagnosis not present

## 2019-12-12 DIAGNOSIS — R262 Difficulty in walking, not elsewhere classified: Secondary | ICD-10-CM | POA: Diagnosis not present

## 2019-12-12 DIAGNOSIS — R2689 Other abnormalities of gait and mobility: Secondary | ICD-10-CM | POA: Diagnosis not present

## 2019-12-12 DIAGNOSIS — M6281 Muscle weakness (generalized): Secondary | ICD-10-CM | POA: Diagnosis not present

## 2019-12-12 DIAGNOSIS — L603 Nail dystrophy: Secondary | ICD-10-CM | POA: Diagnosis not present

## 2019-12-12 DIAGNOSIS — B351 Tinea unguium: Secondary | ICD-10-CM | POA: Diagnosis not present

## 2019-12-13 DIAGNOSIS — M6281 Muscle weakness (generalized): Secondary | ICD-10-CM | POA: Diagnosis not present

## 2019-12-13 DIAGNOSIS — R2689 Other abnormalities of gait and mobility: Secondary | ICD-10-CM | POA: Diagnosis not present

## 2019-12-16 DIAGNOSIS — R2689 Other abnormalities of gait and mobility: Secondary | ICD-10-CM | POA: Diagnosis not present

## 2019-12-16 DIAGNOSIS — M6281 Muscle weakness (generalized): Secondary | ICD-10-CM | POA: Diagnosis not present

## 2019-12-19 DIAGNOSIS — M6281 Muscle weakness (generalized): Secondary | ICD-10-CM | POA: Diagnosis not present

## 2019-12-19 DIAGNOSIS — R2689 Other abnormalities of gait and mobility: Secondary | ICD-10-CM | POA: Diagnosis not present

## 2019-12-24 DIAGNOSIS — E038 Other specified hypothyroidism: Secondary | ICD-10-CM | POA: Diagnosis not present

## 2019-12-24 DIAGNOSIS — I10 Essential (primary) hypertension: Secondary | ICD-10-CM | POA: Diagnosis not present

## 2019-12-24 DIAGNOSIS — F330 Major depressive disorder, recurrent, mild: Secondary | ICD-10-CM | POA: Diagnosis not present

## 2020-01-02 DIAGNOSIS — M6281 Muscle weakness (generalized): Secondary | ICD-10-CM | POA: Diagnosis not present

## 2020-01-02 DIAGNOSIS — R2689 Other abnormalities of gait and mobility: Secondary | ICD-10-CM | POA: Diagnosis not present

## 2020-01-03 DIAGNOSIS — R2689 Other abnormalities of gait and mobility: Secondary | ICD-10-CM | POA: Diagnosis not present

## 2020-01-03 DIAGNOSIS — M6281 Muscle weakness (generalized): Secondary | ICD-10-CM | POA: Diagnosis not present

## 2020-01-04 DIAGNOSIS — M6281 Muscle weakness (generalized): Secondary | ICD-10-CM | POA: Diagnosis not present

## 2020-01-04 DIAGNOSIS — R2689 Other abnormalities of gait and mobility: Secondary | ICD-10-CM | POA: Diagnosis not present

## 2020-01-05 DIAGNOSIS — R2689 Other abnormalities of gait and mobility: Secondary | ICD-10-CM | POA: Diagnosis not present

## 2020-01-05 DIAGNOSIS — M6281 Muscle weakness (generalized): Secondary | ICD-10-CM | POA: Diagnosis not present

## 2020-01-11 DIAGNOSIS — M6281 Muscle weakness (generalized): Secondary | ICD-10-CM | POA: Diagnosis not present

## 2020-01-11 DIAGNOSIS — R2689 Other abnormalities of gait and mobility: Secondary | ICD-10-CM | POA: Diagnosis not present

## 2020-01-12 DIAGNOSIS — R2689 Other abnormalities of gait and mobility: Secondary | ICD-10-CM | POA: Diagnosis not present

## 2020-01-12 DIAGNOSIS — M6281 Muscle weakness (generalized): Secondary | ICD-10-CM | POA: Diagnosis not present

## 2020-01-15 DIAGNOSIS — M6281 Muscle weakness (generalized): Secondary | ICD-10-CM | POA: Diagnosis not present

## 2020-01-15 DIAGNOSIS — R2689 Other abnormalities of gait and mobility: Secondary | ICD-10-CM | POA: Diagnosis not present

## 2020-01-16 DIAGNOSIS — R2689 Other abnormalities of gait and mobility: Secondary | ICD-10-CM | POA: Diagnosis not present

## 2020-01-16 DIAGNOSIS — M6281 Muscle weakness (generalized): Secondary | ICD-10-CM | POA: Diagnosis not present

## 2020-01-18 DIAGNOSIS — M6281 Muscle weakness (generalized): Secondary | ICD-10-CM | POA: Diagnosis not present

## 2020-01-18 DIAGNOSIS — R2689 Other abnormalities of gait and mobility: Secondary | ICD-10-CM | POA: Diagnosis not present

## 2020-01-19 DIAGNOSIS — R2689 Other abnormalities of gait and mobility: Secondary | ICD-10-CM | POA: Diagnosis not present

## 2020-01-19 DIAGNOSIS — M6281 Muscle weakness (generalized): Secondary | ICD-10-CM | POA: Diagnosis not present

## 2020-01-23 DIAGNOSIS — I10 Essential (primary) hypertension: Secondary | ICD-10-CM | POA: Diagnosis not present

## 2020-01-23 DIAGNOSIS — F330 Major depressive disorder, recurrent, mild: Secondary | ICD-10-CM | POA: Diagnosis not present

## 2020-01-23 DIAGNOSIS — E038 Other specified hypothyroidism: Secondary | ICD-10-CM | POA: Diagnosis not present

## 2020-01-30 DIAGNOSIS — Z23 Encounter for immunization: Secondary | ICD-10-CM | POA: Diagnosis not present

## 2020-02-26 DIAGNOSIS — E038 Other specified hypothyroidism: Secondary | ICD-10-CM | POA: Diagnosis not present

## 2020-02-26 DIAGNOSIS — F330 Major depressive disorder, recurrent, mild: Secondary | ICD-10-CM | POA: Diagnosis not present

## 2020-02-26 DIAGNOSIS — I10 Essential (primary) hypertension: Secondary | ICD-10-CM | POA: Diagnosis not present

## 2020-03-15 IMAGING — CR DG CHEST 1V PORT
1 series · 1 of 1 positions shown · non-contrast
Comparison: Portable exam 0678 hours compared to 02/21/2016

CLINICAL DATA: Hypoxia, weakness, history asthma, former smoker

EXAM:
PORTABLE CHEST 1 VIEW

[portable]
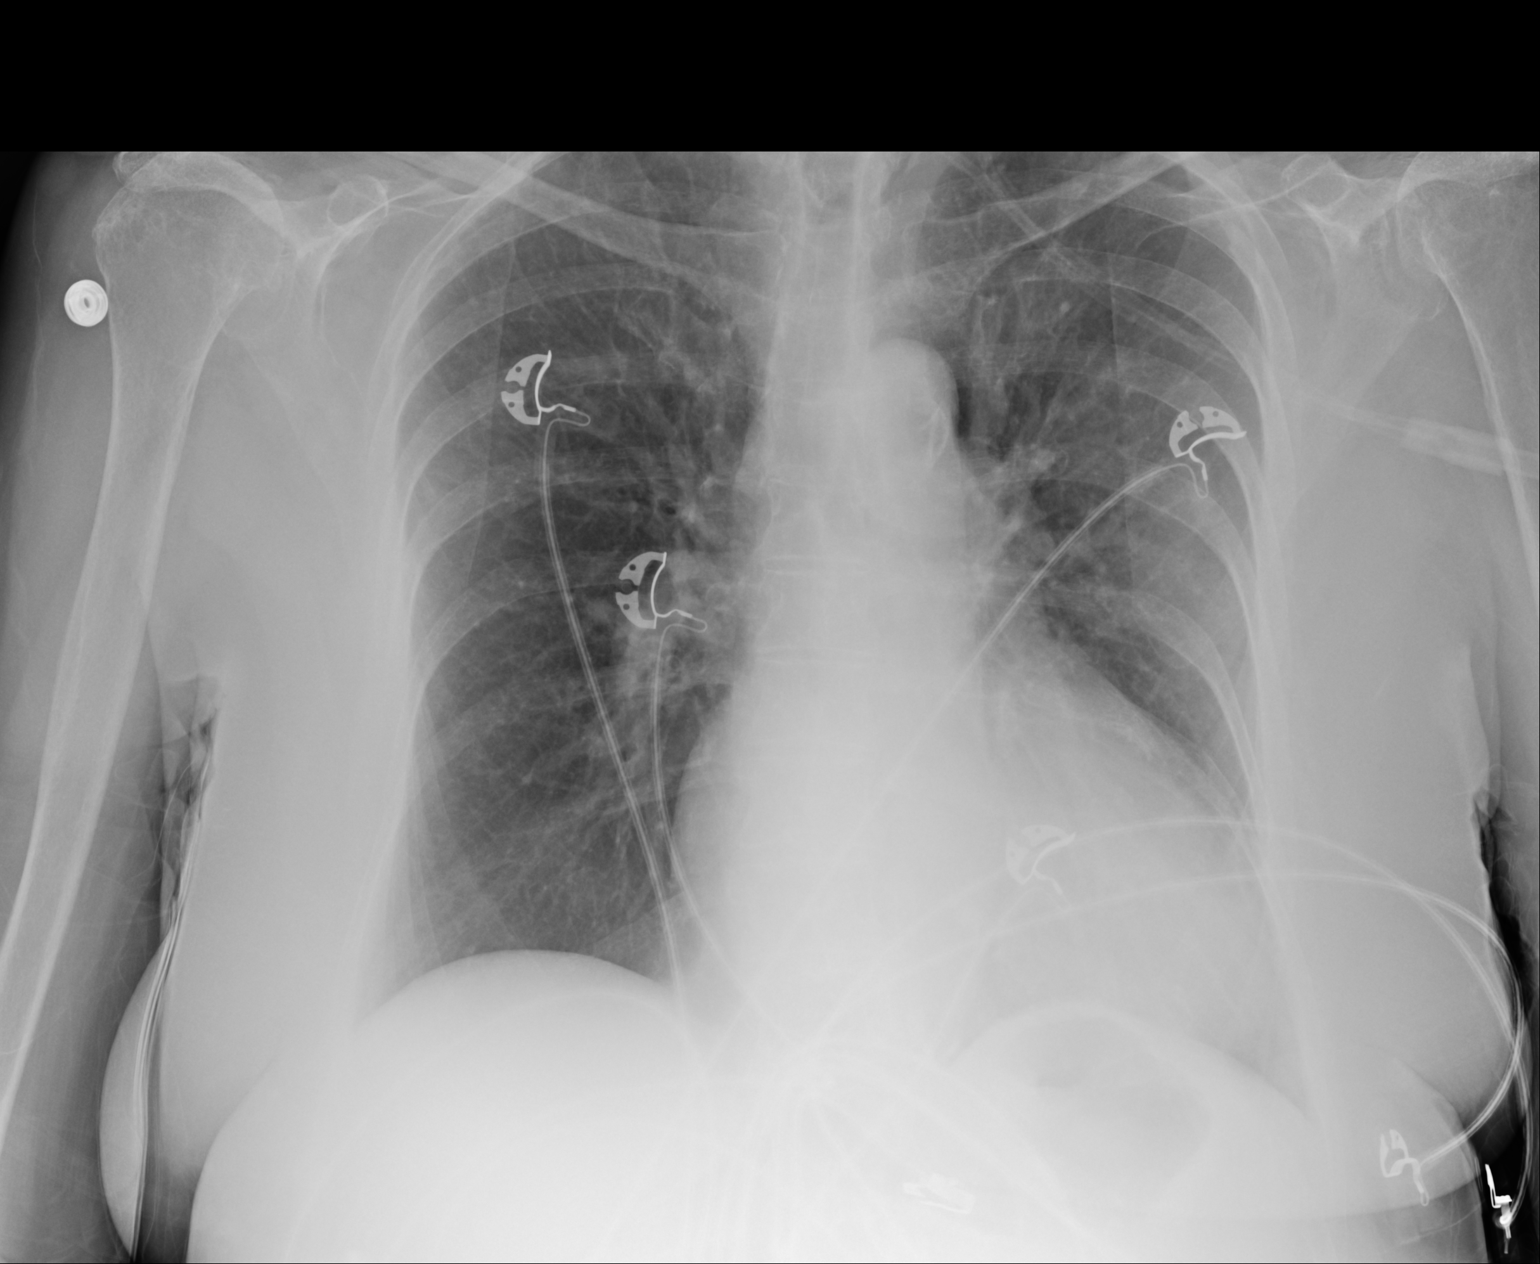

[1 of 1 positions shown; findings below may reference images not displayed]

FINDINGS: Enlargement of cardiac silhouette with pulmonary vascular
congestion.

Atherosclerotic calcification aorta.

Mediastinal contours normal.

Lungs clear.

No acute infiltrate, pleural effusion or pneumothorax.

Osseous demineralization with BILATERAL chronic rotator cuff tears.
IMPRESSION: Enlargement of cardiac silhouette with pulmonary vascular
congestion.

No acute abnormalities.

## 2020-03-18 IMAGING — DX DG CHEST 1V PORT
1 series · 1 of 1 positions shown · non-contrast
Comparison: CT chest 05/31/2017

CLINICAL DATA: Flu, cough, congestion

EXAM:
PORTABLE CHEST 1 VIEW

[chest]
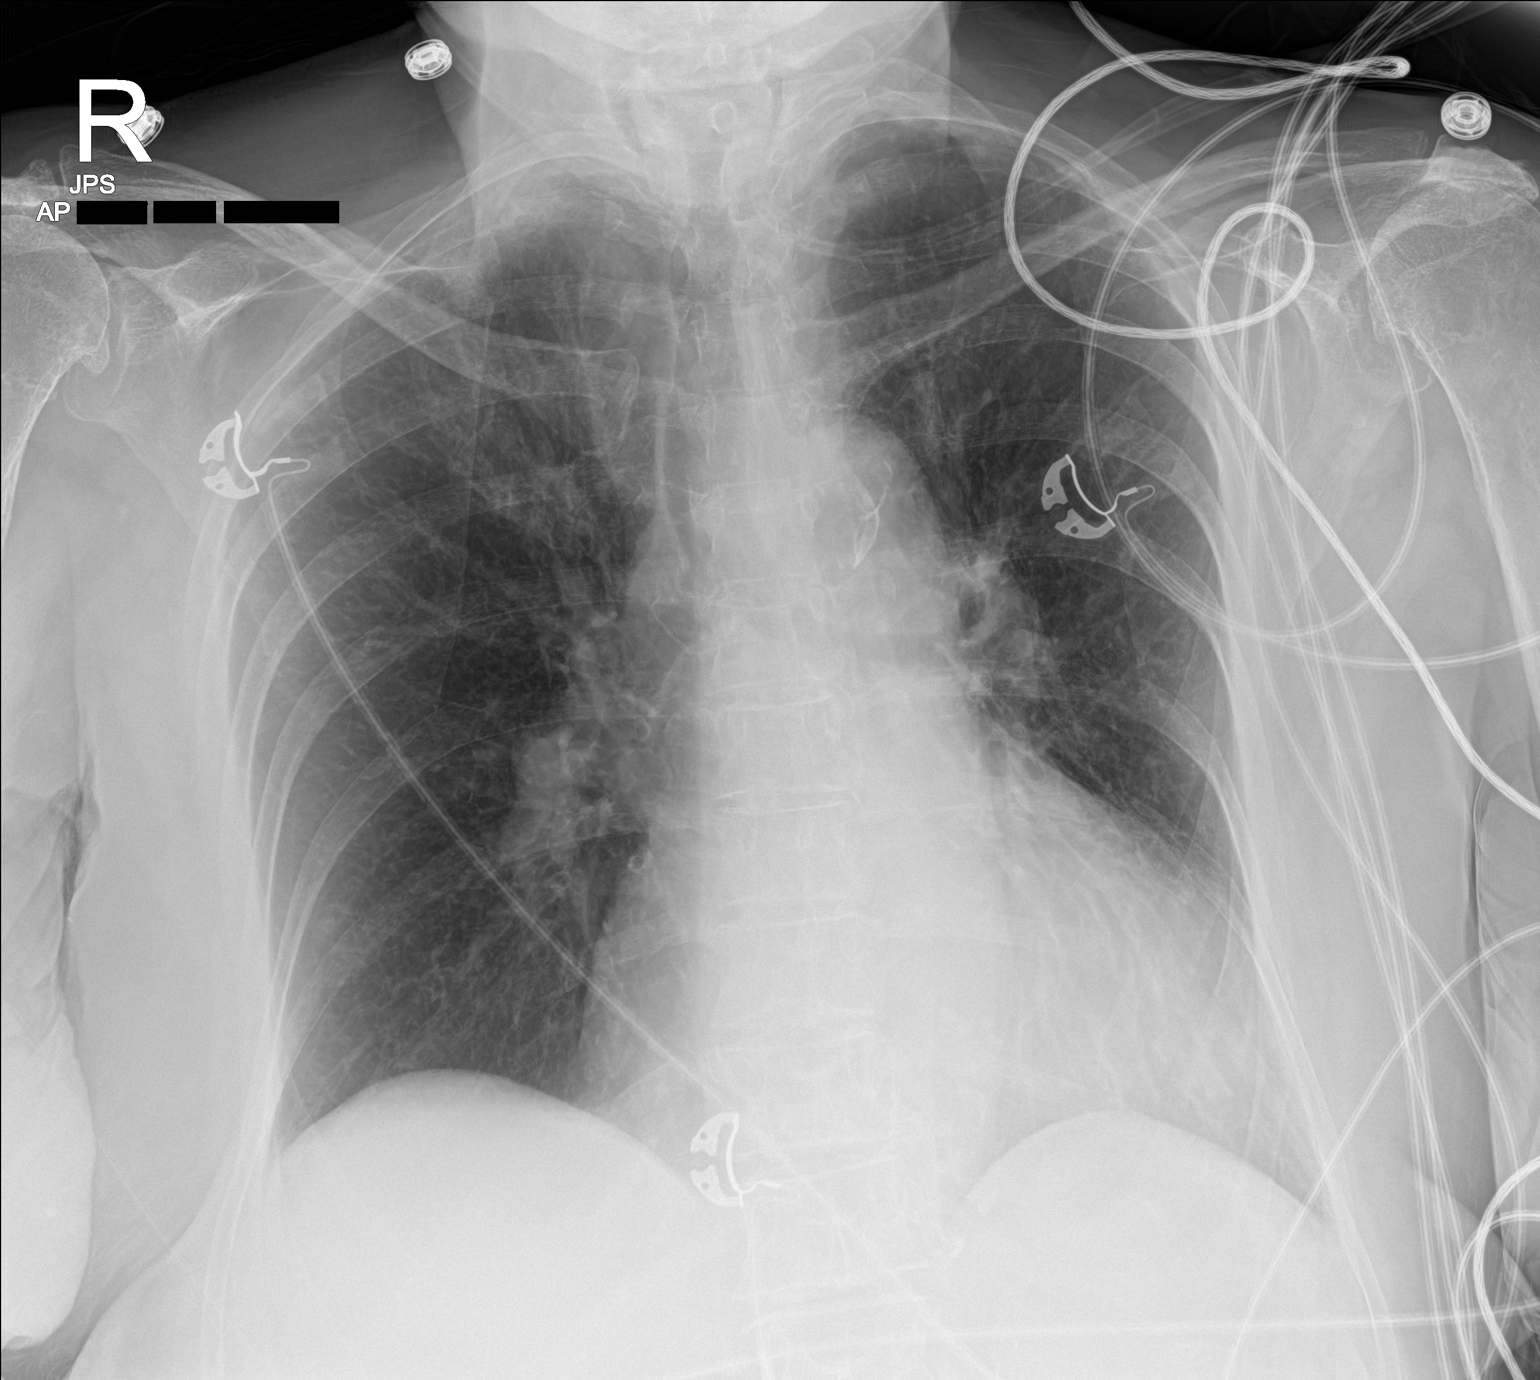

[1 of 1 positions shown; findings below may reference images not displayed]

FINDINGS: There is no focal parenchymal opacity. There is no pleural effusion
or pneumothorax. There is stable cardiomegaly. There is thoracic
aortic atherosclerosis.

There is moderate osteoarthritis of bilateral glenohumeral joints.
IMPRESSION: No active disease.

## 2020-03-28 DIAGNOSIS — F330 Major depressive disorder, recurrent, mild: Secondary | ICD-10-CM | POA: Diagnosis not present

## 2020-03-28 DIAGNOSIS — I10 Essential (primary) hypertension: Secondary | ICD-10-CM | POA: Diagnosis not present

## 2020-03-28 DIAGNOSIS — E038 Other specified hypothyroidism: Secondary | ICD-10-CM | POA: Diagnosis not present

## 2020-04-25 DIAGNOSIS — R739 Hyperglycemia, unspecified: Secondary | ICD-10-CM | POA: Diagnosis not present

## 2020-04-29 DIAGNOSIS — F330 Major depressive disorder, recurrent, mild: Secondary | ICD-10-CM | POA: Diagnosis not present

## 2020-04-29 DIAGNOSIS — I10 Essential (primary) hypertension: Secondary | ICD-10-CM | POA: Diagnosis not present

## 2020-04-29 DIAGNOSIS — E038 Other specified hypothyroidism: Secondary | ICD-10-CM | POA: Diagnosis not present

## 2020-05-13 DIAGNOSIS — G4700 Insomnia, unspecified: Secondary | ICD-10-CM | POA: Diagnosis not present

## 2020-05-13 DIAGNOSIS — F0390 Unspecified dementia without behavioral disturbance: Secondary | ICD-10-CM | POA: Diagnosis not present

## 2020-05-13 DIAGNOSIS — R630 Anorexia: Secondary | ICD-10-CM | POA: Diagnosis not present

## 2020-05-28 DIAGNOSIS — I10 Essential (primary) hypertension: Secondary | ICD-10-CM | POA: Diagnosis not present

## 2020-05-28 DIAGNOSIS — F330 Major depressive disorder, recurrent, mild: Secondary | ICD-10-CM | POA: Diagnosis not present

## 2020-05-28 DIAGNOSIS — E038 Other specified hypothyroidism: Secondary | ICD-10-CM | POA: Diagnosis not present

## 2020-05-30 DIAGNOSIS — R630 Anorexia: Secondary | ICD-10-CM | POA: Diagnosis not present

## 2020-05-30 DIAGNOSIS — F0390 Unspecified dementia without behavioral disturbance: Secondary | ICD-10-CM | POA: Diagnosis not present

## 2020-05-30 DIAGNOSIS — G4700 Insomnia, unspecified: Secondary | ICD-10-CM | POA: Diagnosis not present

## 2020-06-13 DIAGNOSIS — I739 Peripheral vascular disease, unspecified: Secondary | ICD-10-CM | POA: Diagnosis not present

## 2020-06-13 DIAGNOSIS — L602 Onychogryphosis: Secondary | ICD-10-CM | POA: Diagnosis not present

## 2020-06-24 DIAGNOSIS — L089 Local infection of the skin and subcutaneous tissue, unspecified: Secondary | ICD-10-CM | POA: Diagnosis not present

## 2020-06-27 DIAGNOSIS — E038 Other specified hypothyroidism: Secondary | ICD-10-CM | POA: Diagnosis not present

## 2020-06-27 DIAGNOSIS — I10 Essential (primary) hypertension: Secondary | ICD-10-CM | POA: Diagnosis not present

## 2020-06-27 DIAGNOSIS — R630 Anorexia: Secondary | ICD-10-CM | POA: Diagnosis not present

## 2020-06-27 DIAGNOSIS — F0390 Unspecified dementia without behavioral disturbance: Secondary | ICD-10-CM | POA: Diagnosis not present

## 2020-06-27 DIAGNOSIS — G4700 Insomnia, unspecified: Secondary | ICD-10-CM | POA: Diagnosis not present

## 2020-06-27 DIAGNOSIS — F330 Major depressive disorder, recurrent, mild: Secondary | ICD-10-CM | POA: Diagnosis not present

## 2020-07-25 DIAGNOSIS — F0390 Unspecified dementia without behavioral disturbance: Secondary | ICD-10-CM | POA: Diagnosis not present

## 2020-07-25 DIAGNOSIS — F5101 Primary insomnia: Secondary | ICD-10-CM | POA: Diagnosis not present

## 2020-07-25 DIAGNOSIS — R630 Anorexia: Secondary | ICD-10-CM | POA: Diagnosis not present

## 2020-07-29 DIAGNOSIS — I10 Essential (primary) hypertension: Secondary | ICD-10-CM | POA: Diagnosis not present

## 2020-07-29 DIAGNOSIS — F330 Major depressive disorder, recurrent, mild: Secondary | ICD-10-CM | POA: Diagnosis not present

## 2020-07-29 DIAGNOSIS — E038 Other specified hypothyroidism: Secondary | ICD-10-CM | POA: Diagnosis not present

## 2020-08-28 DIAGNOSIS — E038 Other specified hypothyroidism: Secondary | ICD-10-CM | POA: Diagnosis not present

## 2020-08-28 DIAGNOSIS — I10 Essential (primary) hypertension: Secondary | ICD-10-CM | POA: Diagnosis not present

## 2020-08-28 DIAGNOSIS — F330 Major depressive disorder, recurrent, mild: Secondary | ICD-10-CM | POA: Diagnosis not present

## 2020-08-29 DIAGNOSIS — R630 Anorexia: Secondary | ICD-10-CM | POA: Diagnosis not present

## 2020-08-29 DIAGNOSIS — F0390 Unspecified dementia without behavioral disturbance: Secondary | ICD-10-CM | POA: Diagnosis not present

## 2020-08-29 DIAGNOSIS — F5101 Primary insomnia: Secondary | ICD-10-CM | POA: Diagnosis not present

## 2020-09-27 DIAGNOSIS — E038 Other specified hypothyroidism: Secondary | ICD-10-CM | POA: Diagnosis not present

## 2020-09-27 DIAGNOSIS — F330 Major depressive disorder, recurrent, mild: Secondary | ICD-10-CM | POA: Diagnosis not present

## 2020-09-27 DIAGNOSIS — I10 Essential (primary) hypertension: Secondary | ICD-10-CM | POA: Diagnosis not present

## 2020-10-02 DIAGNOSIS — Z23 Encounter for immunization: Secondary | ICD-10-CM | POA: Diagnosis not present

## 2020-10-03 DIAGNOSIS — F5101 Primary insomnia: Secondary | ICD-10-CM | POA: Diagnosis not present

## 2020-10-03 DIAGNOSIS — F0390 Unspecified dementia without behavioral disturbance: Secondary | ICD-10-CM | POA: Diagnosis not present

## 2020-10-03 DIAGNOSIS — R630 Anorexia: Secondary | ICD-10-CM | POA: Diagnosis not present

## 2020-10-16 DIAGNOSIS — Z961 Presence of intraocular lens: Secondary | ICD-10-CM | POA: Diagnosis not present

## 2020-10-16 DIAGNOSIS — H524 Presbyopia: Secondary | ICD-10-CM | POA: Diagnosis not present

## 2020-10-16 DIAGNOSIS — H401134 Primary open-angle glaucoma, bilateral, indeterminate stage: Secondary | ICD-10-CM | POA: Diagnosis not present

## 2020-10-28 DIAGNOSIS — I10 Essential (primary) hypertension: Secondary | ICD-10-CM | POA: Diagnosis not present

## 2020-10-28 DIAGNOSIS — F330 Major depressive disorder, recurrent, mild: Secondary | ICD-10-CM | POA: Diagnosis not present

## 2020-10-28 DIAGNOSIS — E038 Other specified hypothyroidism: Secondary | ICD-10-CM | POA: Diagnosis not present

## 2020-11-12 DIAGNOSIS — I739 Peripheral vascular disease, unspecified: Secondary | ICD-10-CM | POA: Diagnosis not present

## 2020-11-12 DIAGNOSIS — L603 Nail dystrophy: Secondary | ICD-10-CM | POA: Diagnosis not present

## 2020-11-12 DIAGNOSIS — L602 Onychogryphosis: Secondary | ICD-10-CM | POA: Diagnosis not present

## 2020-12-02 DIAGNOSIS — F330 Major depressive disorder, recurrent, mild: Secondary | ICD-10-CM | POA: Diagnosis not present

## 2020-12-02 DIAGNOSIS — E038 Other specified hypothyroidism: Secondary | ICD-10-CM | POA: Diagnosis not present

## 2020-12-02 DIAGNOSIS — I10 Essential (primary) hypertension: Secondary | ICD-10-CM | POA: Diagnosis not present

## 2020-12-05 DIAGNOSIS — F0390 Unspecified dementia without behavioral disturbance: Secondary | ICD-10-CM | POA: Diagnosis not present

## 2020-12-05 DIAGNOSIS — F5101 Primary insomnia: Secondary | ICD-10-CM | POA: Diagnosis not present

## 2020-12-12 DIAGNOSIS — Z23 Encounter for immunization: Secondary | ICD-10-CM | POA: Diagnosis not present

## 2021-01-01 DIAGNOSIS — I10 Essential (primary) hypertension: Secondary | ICD-10-CM | POA: Diagnosis not present

## 2021-01-01 DIAGNOSIS — E038 Other specified hypothyroidism: Secondary | ICD-10-CM | POA: Diagnosis not present

## 2021-01-01 DIAGNOSIS — F330 Major depressive disorder, recurrent, mild: Secondary | ICD-10-CM | POA: Diagnosis not present

## 2021-01-02 DIAGNOSIS — F0390 Unspecified dementia without behavioral disturbance: Secondary | ICD-10-CM | POA: Diagnosis not present

## 2021-01-02 DIAGNOSIS — F5101 Primary insomnia: Secondary | ICD-10-CM | POA: Diagnosis not present

## 2021-01-28 DIAGNOSIS — Z23 Encounter for immunization: Secondary | ICD-10-CM | POA: Diagnosis not present

## 2021-02-03 DIAGNOSIS — E038 Other specified hypothyroidism: Secondary | ICD-10-CM | POA: Diagnosis not present

## 2021-02-03 DIAGNOSIS — F330 Major depressive disorder, recurrent, mild: Secondary | ICD-10-CM | POA: Diagnosis not present

## 2021-02-03 DIAGNOSIS — I10 Essential (primary) hypertension: Secondary | ICD-10-CM | POA: Diagnosis not present

## 2021-02-26 DIAGNOSIS — R2689 Other abnormalities of gait and mobility: Secondary | ICD-10-CM | POA: Diagnosis not present

## 2021-02-26 DIAGNOSIS — G309 Alzheimer's disease, unspecified: Secondary | ICD-10-CM | POA: Diagnosis not present

## 2021-02-26 DIAGNOSIS — M6281 Muscle weakness (generalized): Secondary | ICD-10-CM | POA: Diagnosis not present

## 2021-02-28 DIAGNOSIS — M6281 Muscle weakness (generalized): Secondary | ICD-10-CM | POA: Diagnosis not present

## 2021-02-28 DIAGNOSIS — G309 Alzheimer's disease, unspecified: Secondary | ICD-10-CM | POA: Diagnosis not present

## 2021-02-28 DIAGNOSIS — R2689 Other abnormalities of gait and mobility: Secondary | ICD-10-CM | POA: Diagnosis not present

## 2021-03-01 DIAGNOSIS — M6281 Muscle weakness (generalized): Secondary | ICD-10-CM | POA: Diagnosis not present

## 2021-03-01 DIAGNOSIS — R2689 Other abnormalities of gait and mobility: Secondary | ICD-10-CM | POA: Diagnosis not present

## 2021-03-01 DIAGNOSIS — G309 Alzheimer's disease, unspecified: Secondary | ICD-10-CM | POA: Diagnosis not present

## 2021-03-04 DIAGNOSIS — R2689 Other abnormalities of gait and mobility: Secondary | ICD-10-CM | POA: Diagnosis not present

## 2021-03-04 DIAGNOSIS — G309 Alzheimer's disease, unspecified: Secondary | ICD-10-CM | POA: Diagnosis not present

## 2021-03-04 DIAGNOSIS — M6281 Muscle weakness (generalized): Secondary | ICD-10-CM | POA: Diagnosis not present

## 2021-03-05 DIAGNOSIS — M6281 Muscle weakness (generalized): Secondary | ICD-10-CM | POA: Diagnosis not present

## 2021-03-05 DIAGNOSIS — G309 Alzheimer's disease, unspecified: Secondary | ICD-10-CM | POA: Diagnosis not present

## 2021-03-05 DIAGNOSIS — I10 Essential (primary) hypertension: Secondary | ICD-10-CM | POA: Diagnosis not present

## 2021-03-05 DIAGNOSIS — R2689 Other abnormalities of gait and mobility: Secondary | ICD-10-CM | POA: Diagnosis not present

## 2021-03-05 DIAGNOSIS — F330 Major depressive disorder, recurrent, mild: Secondary | ICD-10-CM | POA: Diagnosis not present

## 2021-03-05 DIAGNOSIS — E038 Other specified hypothyroidism: Secondary | ICD-10-CM | POA: Diagnosis not present

## 2021-03-05 DIAGNOSIS — F0390 Unspecified dementia without behavioral disturbance: Secondary | ICD-10-CM | POA: Diagnosis not present

## 2021-03-05 DIAGNOSIS — F5101 Primary insomnia: Secondary | ICD-10-CM | POA: Diagnosis not present

## 2021-03-06 DIAGNOSIS — M6281 Muscle weakness (generalized): Secondary | ICD-10-CM | POA: Diagnosis not present

## 2021-03-06 DIAGNOSIS — G309 Alzheimer's disease, unspecified: Secondary | ICD-10-CM | POA: Diagnosis not present

## 2021-03-06 DIAGNOSIS — R2689 Other abnormalities of gait and mobility: Secondary | ICD-10-CM | POA: Diagnosis not present

## 2021-03-07 DIAGNOSIS — R2689 Other abnormalities of gait and mobility: Secondary | ICD-10-CM | POA: Diagnosis not present

## 2021-03-07 DIAGNOSIS — G309 Alzheimer's disease, unspecified: Secondary | ICD-10-CM | POA: Diagnosis not present

## 2021-03-07 DIAGNOSIS — M6281 Muscle weakness (generalized): Secondary | ICD-10-CM | POA: Diagnosis not present

## 2021-03-10 DIAGNOSIS — R2689 Other abnormalities of gait and mobility: Secondary | ICD-10-CM | POA: Diagnosis not present

## 2021-03-10 DIAGNOSIS — M6281 Muscle weakness (generalized): Secondary | ICD-10-CM | POA: Diagnosis not present

## 2021-03-10 DIAGNOSIS — G309 Alzheimer's disease, unspecified: Secondary | ICD-10-CM | POA: Diagnosis not present

## 2021-03-11 DIAGNOSIS — R2689 Other abnormalities of gait and mobility: Secondary | ICD-10-CM | POA: Diagnosis not present

## 2021-03-11 DIAGNOSIS — M6281 Muscle weakness (generalized): Secondary | ICD-10-CM | POA: Diagnosis not present

## 2021-03-11 DIAGNOSIS — G309 Alzheimer's disease, unspecified: Secondary | ICD-10-CM | POA: Diagnosis not present

## 2021-03-12 DIAGNOSIS — G309 Alzheimer's disease, unspecified: Secondary | ICD-10-CM | POA: Diagnosis not present

## 2021-03-12 DIAGNOSIS — R2689 Other abnormalities of gait and mobility: Secondary | ICD-10-CM | POA: Diagnosis not present

## 2021-03-12 DIAGNOSIS — M6281 Muscle weakness (generalized): Secondary | ICD-10-CM | POA: Diagnosis not present

## 2021-03-13 DIAGNOSIS — R2689 Other abnormalities of gait and mobility: Secondary | ICD-10-CM | POA: Diagnosis not present

## 2021-03-13 DIAGNOSIS — G309 Alzheimer's disease, unspecified: Secondary | ICD-10-CM | POA: Diagnosis not present

## 2021-03-13 DIAGNOSIS — M6281 Muscle weakness (generalized): Secondary | ICD-10-CM | POA: Diagnosis not present

## 2021-03-14 DIAGNOSIS — G309 Alzheimer's disease, unspecified: Secondary | ICD-10-CM | POA: Diagnosis not present

## 2021-03-14 DIAGNOSIS — M6281 Muscle weakness (generalized): Secondary | ICD-10-CM | POA: Diagnosis not present

## 2021-03-14 DIAGNOSIS — R2689 Other abnormalities of gait and mobility: Secondary | ICD-10-CM | POA: Diagnosis not present

## 2022-02-13 DEATH — deceased
